# Patient Record
Sex: Female | Born: 1940 | Race: White | Hispanic: No
Health system: Midwestern US, Community
[De-identification: ages and names within clinical notes are randomized; demographics above are authoritative.]

---

## 2021-08-02 IMAGING — MG SCREENING MAMMOGRAM DIGITAL
4 series · 4 of 4 positions shown · non-contrast
Comparison: 10/09/2021 and 08/27/2020
COMPARISON: 10/09/2021 and 08/27/2020

------------- REPORT [id number] -------------
Procedure(s): MM screening mammo BI

DIGITAL SCREENING BILATERAL MAMMOGRAM
INDICATION: Routine screening

[R CC]
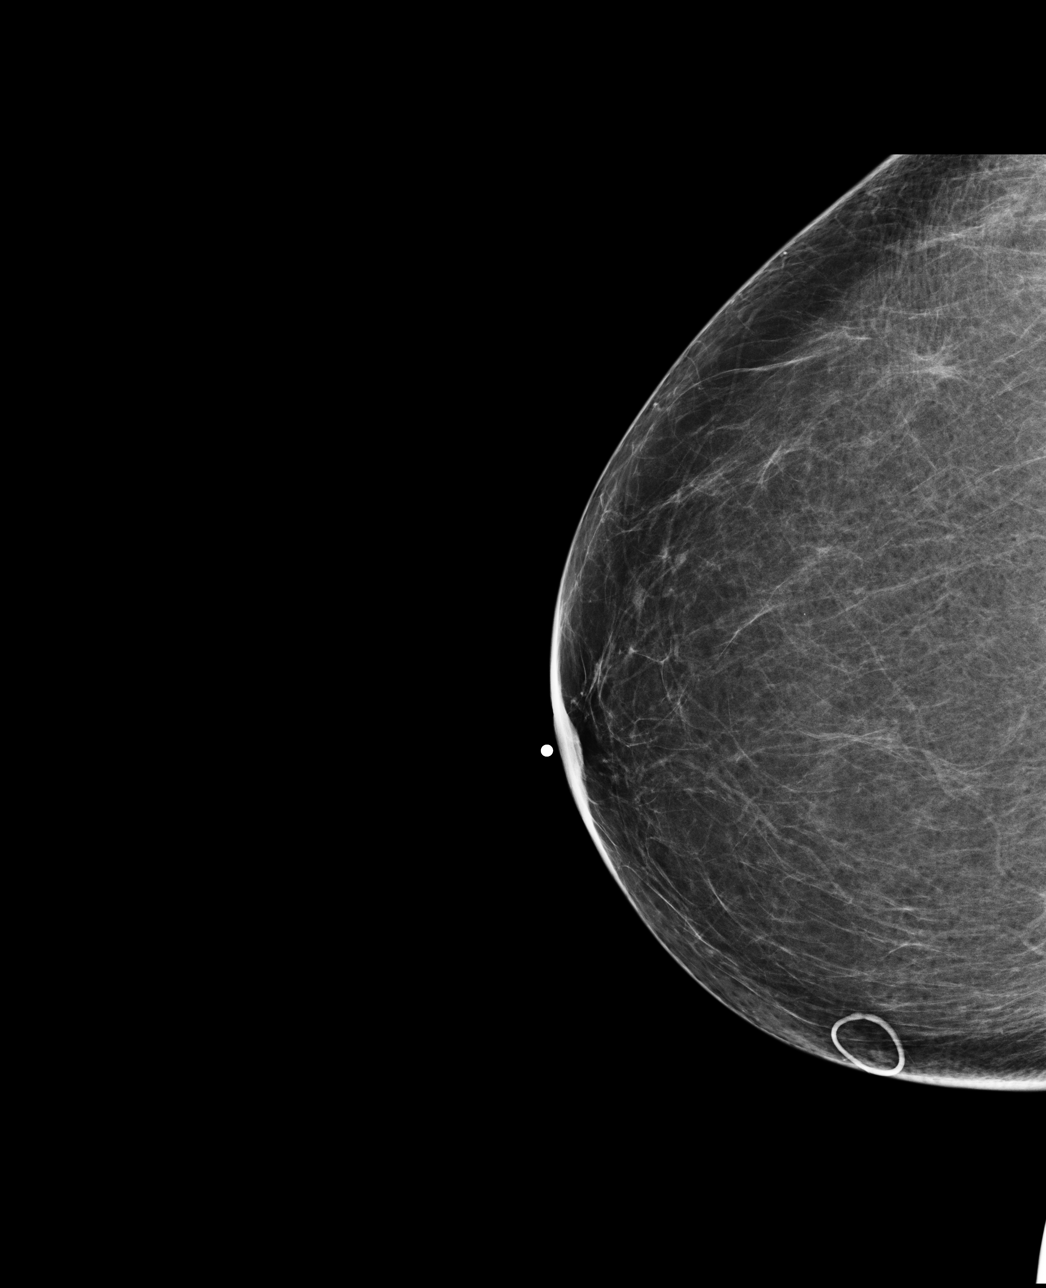

[R MLO]
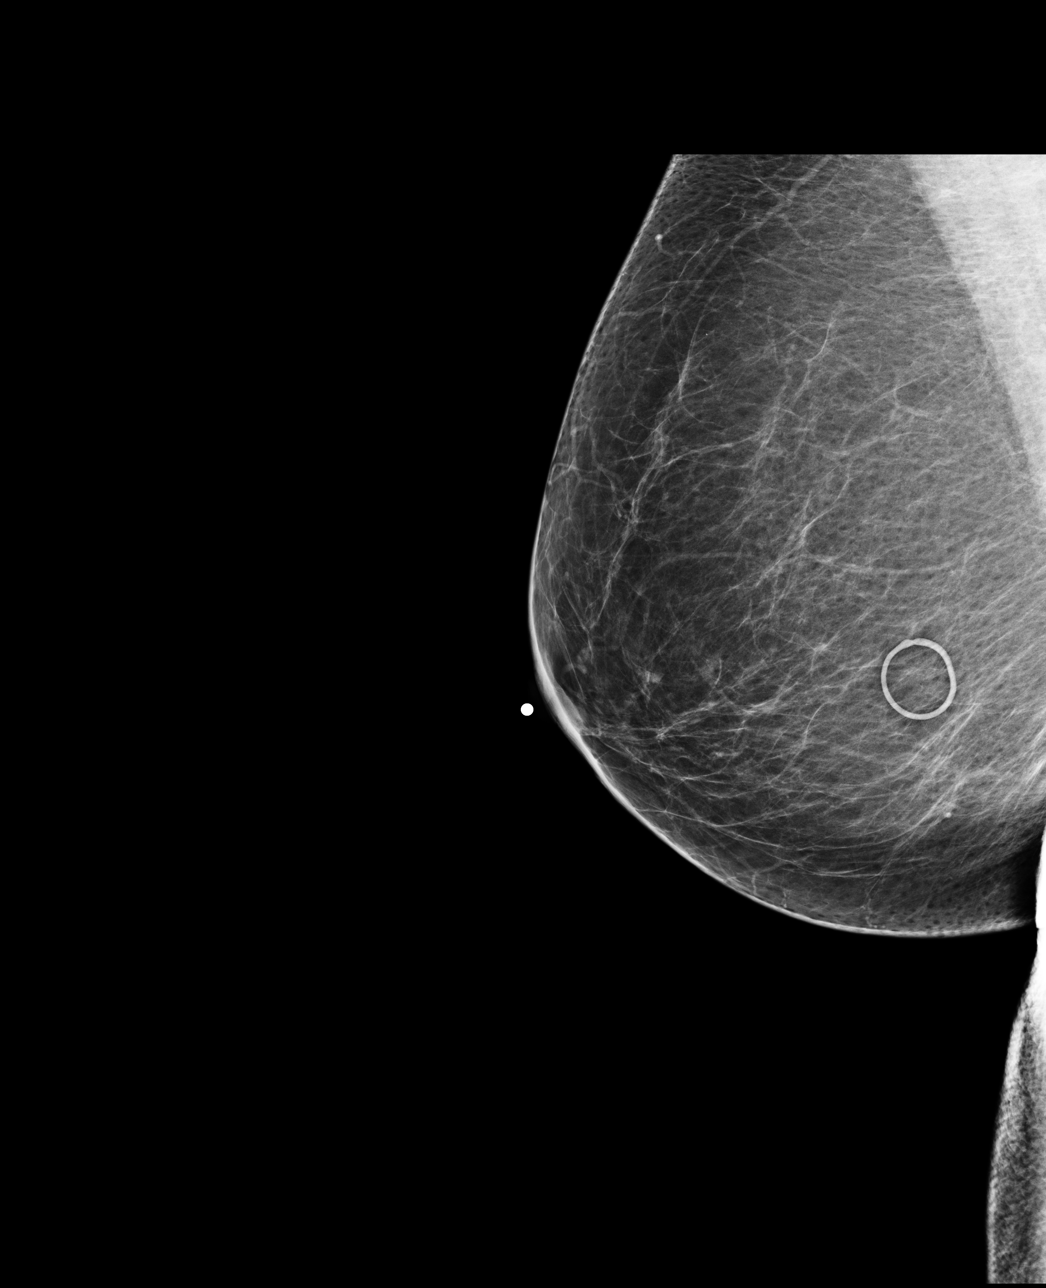

[L MLO]
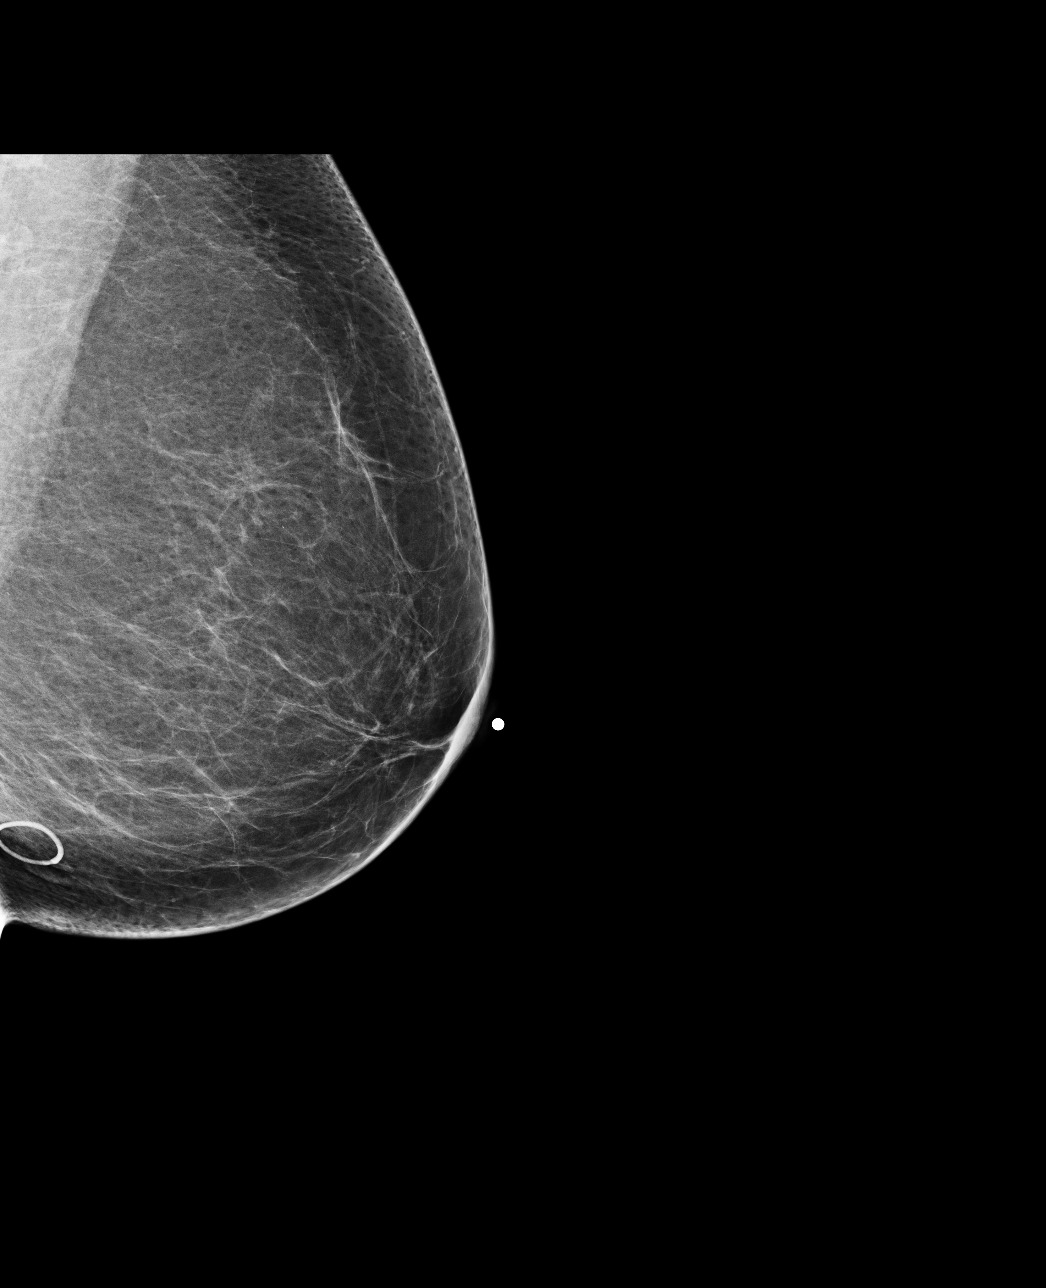

[L CC]
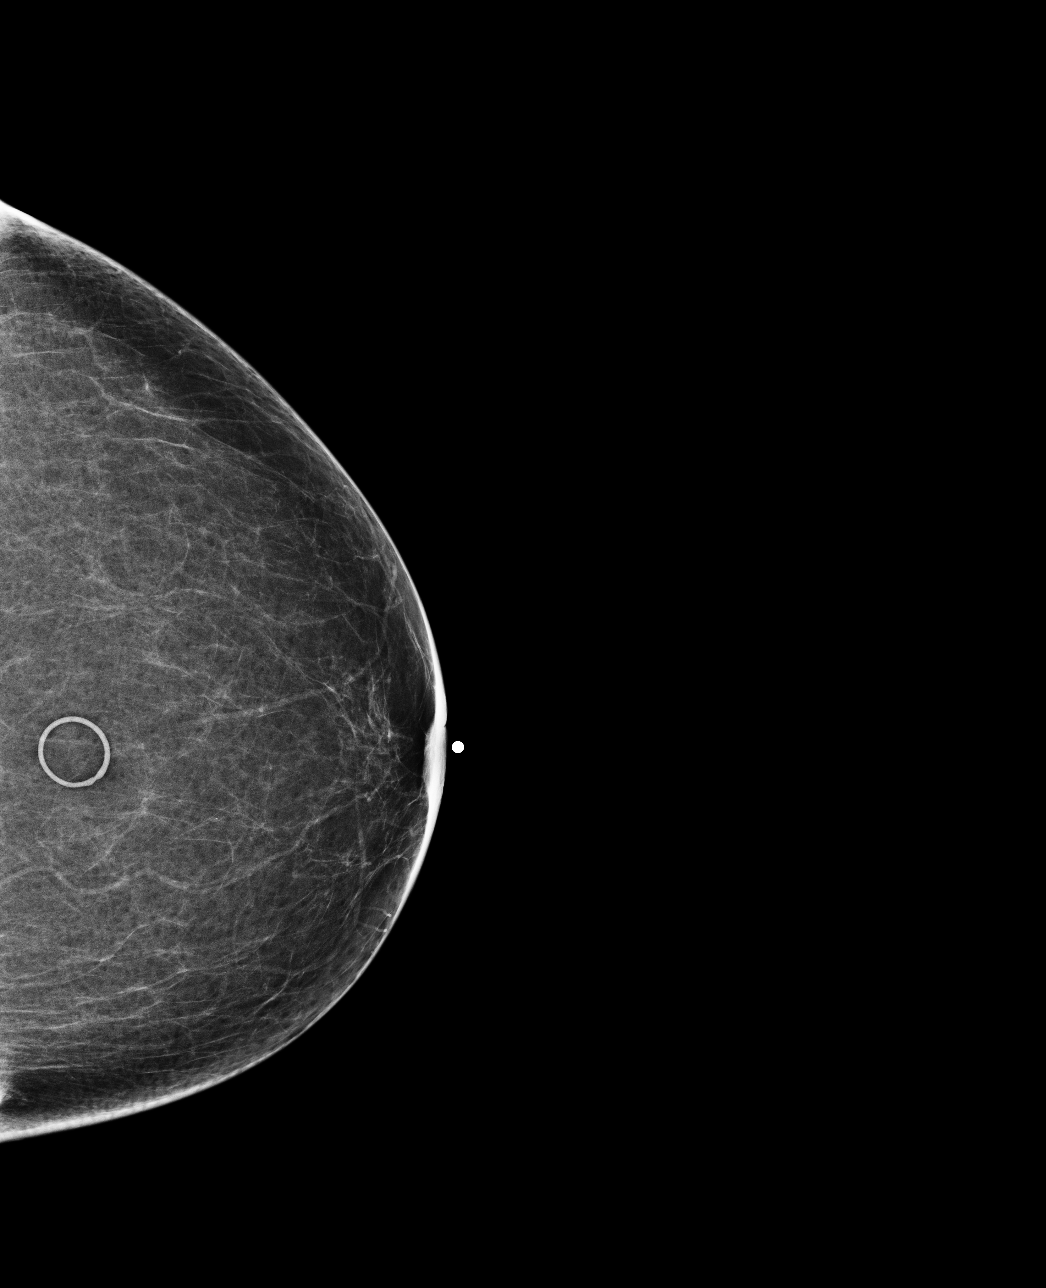

[4 of 4 positions shown; findings below may reference images not displayed]

FINDINGS: Breast density: Category A (almost entirely fatty)

No dominant masses, areas of architectural distortion or
suspicious clusters of
microcalcifications are seen.
IMPRESSION: Normal mammogram.  No significant change from
previous study

BI-RADS: 1 (normal-annual screening mammography)

Our facilities are accredited by the [HOSPITAL] Mammography
Program.

------------- REPORT [id number] -------------
Procedure(s): MM screening mammo BI

DIGITAL SCREENING BILATERAL MAMMOGRAM
FINDINGS: Breast density: Category A (almost entirely fatty)

No dominant masses, areas of architectural distortion or
suspicious clusters of
microcalcifications are seen.
IMPRESSION: Normal mammogram.  No significant change from
previous study

BI-RADS: 1 (normal-annual screening mammography)

Our facilities are accredited by the [HOSPITAL] Mammography
Program.

## 2022-02-21 IMAGING — DX C-Spine
3 series · 3 of 3 positions shown · non-contrast
Comparison: None

Procedure(s): XR cervical spine 3V

EXAM:  CERVICAL SPINE
INDICATION: Three-view cervical spine, pain

[left lateral]
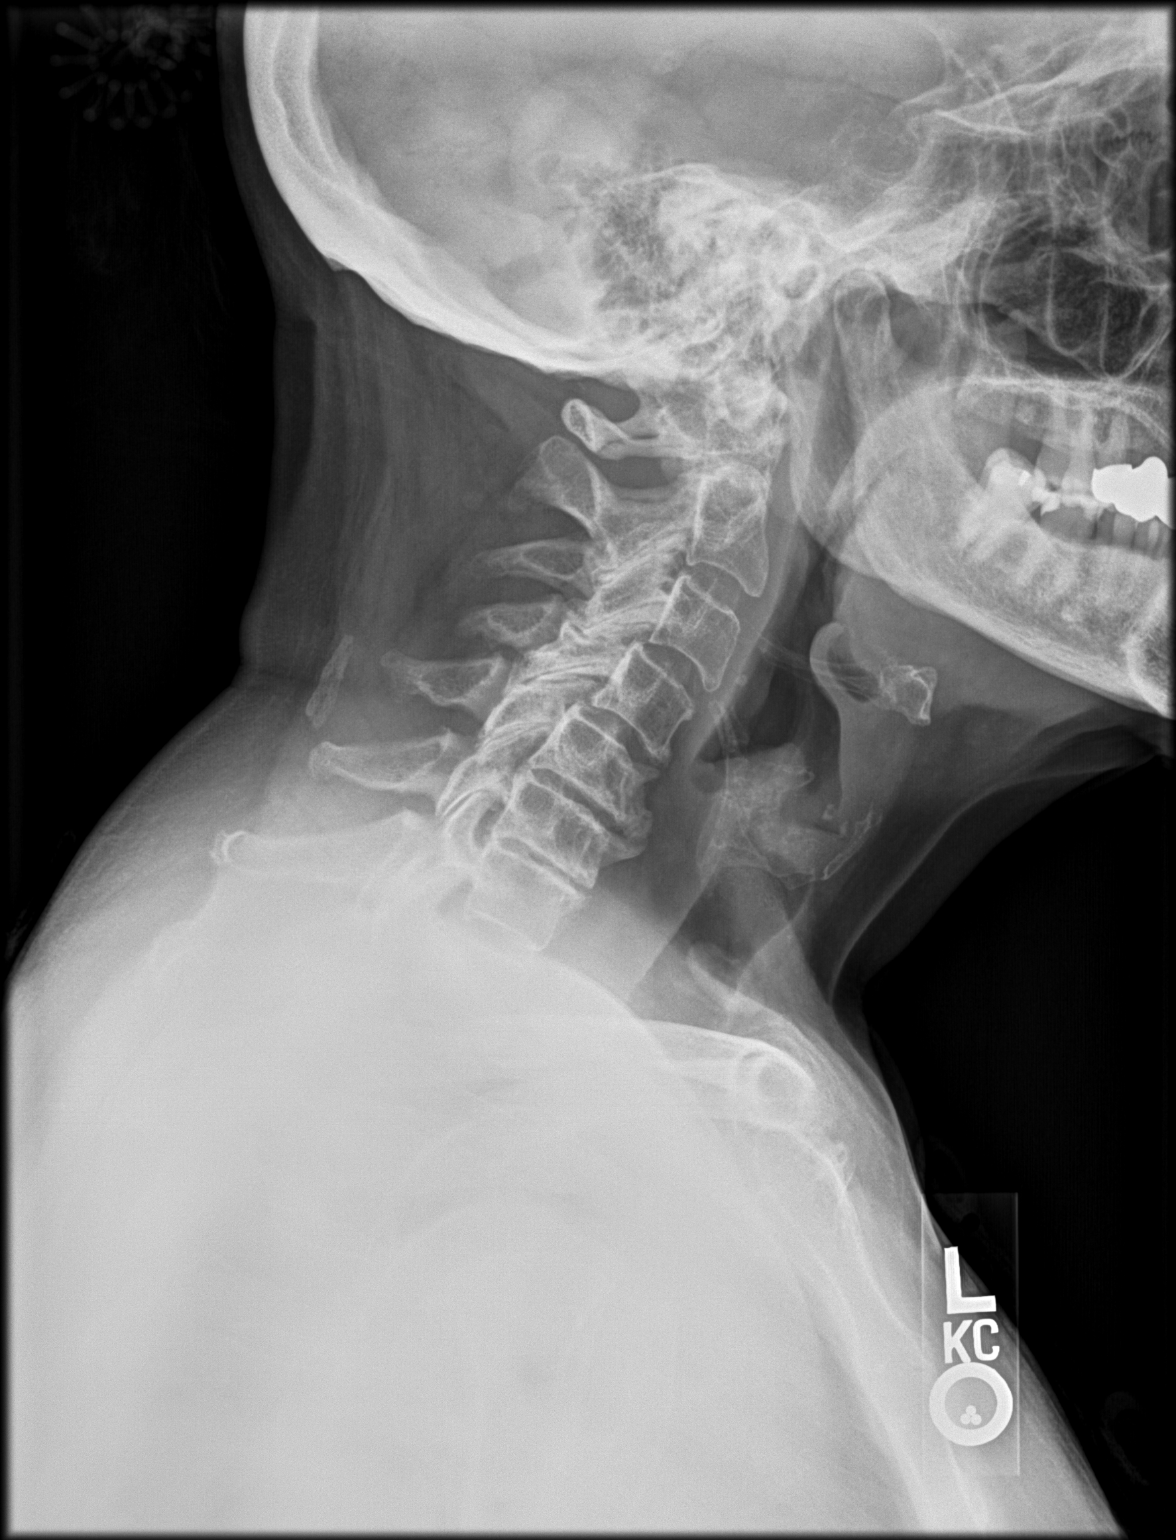

[AP (1 of 2)]
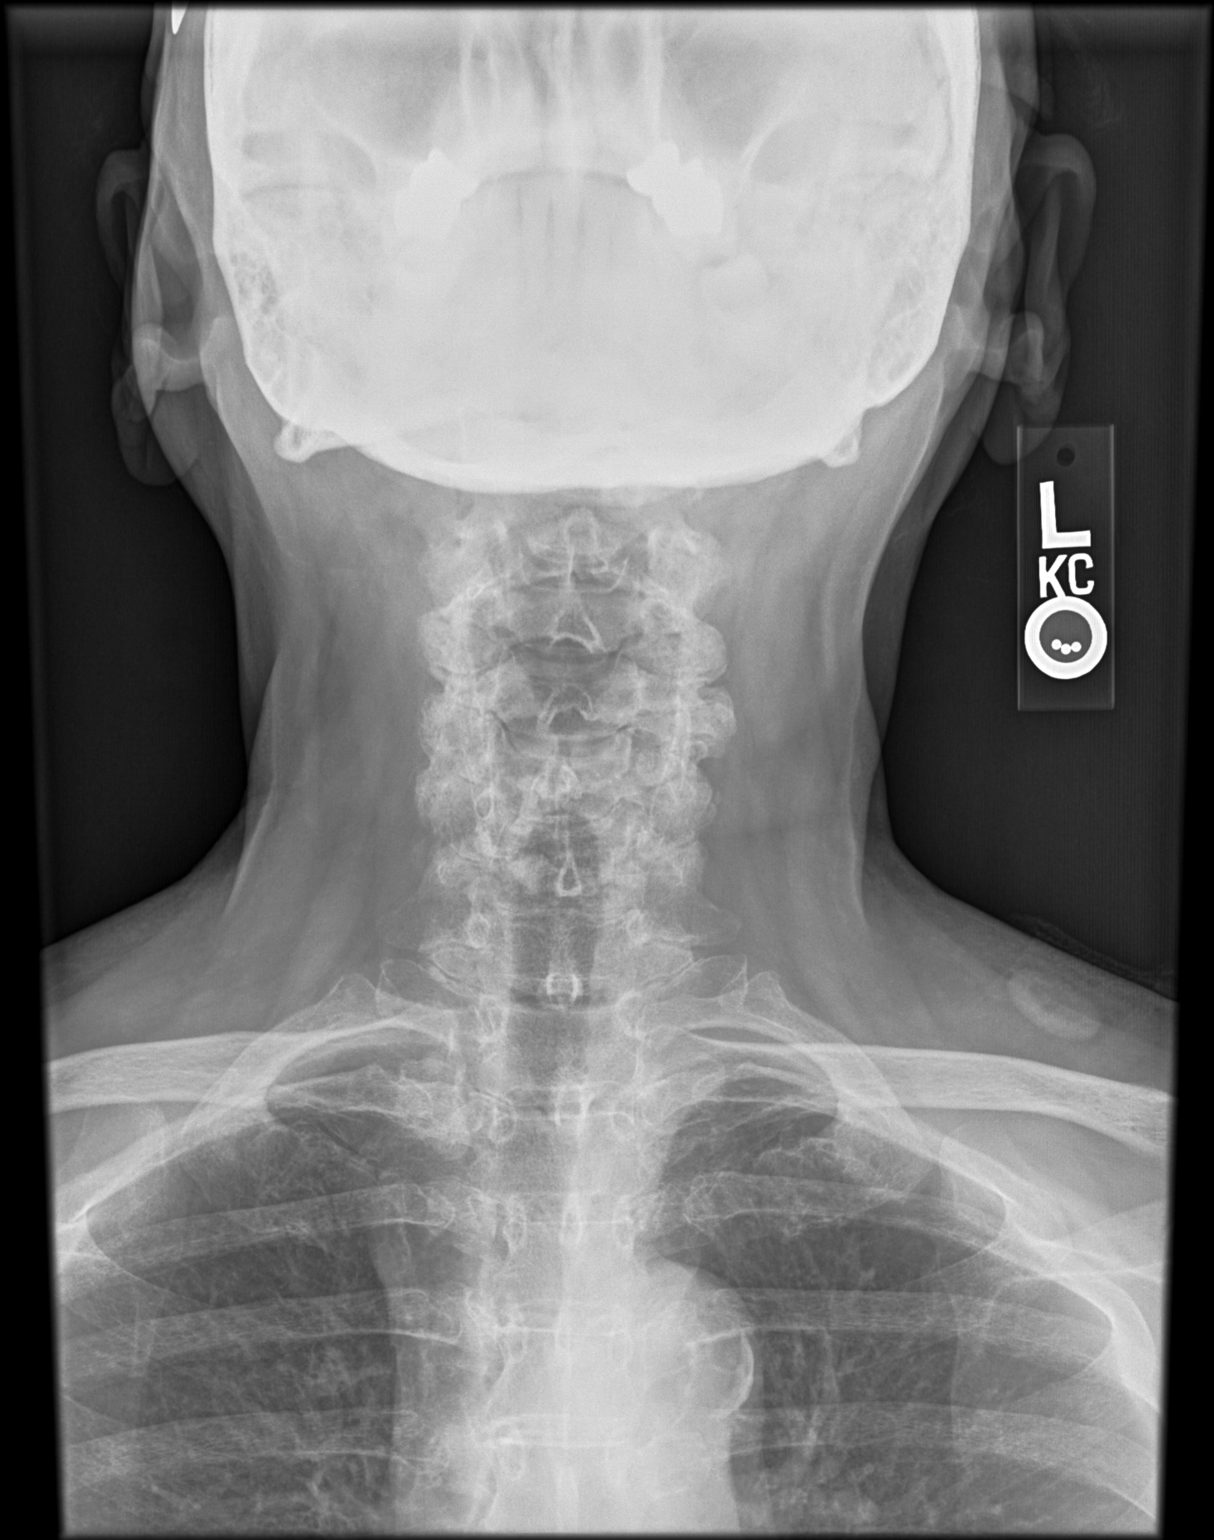

[AP (2 of 2)]
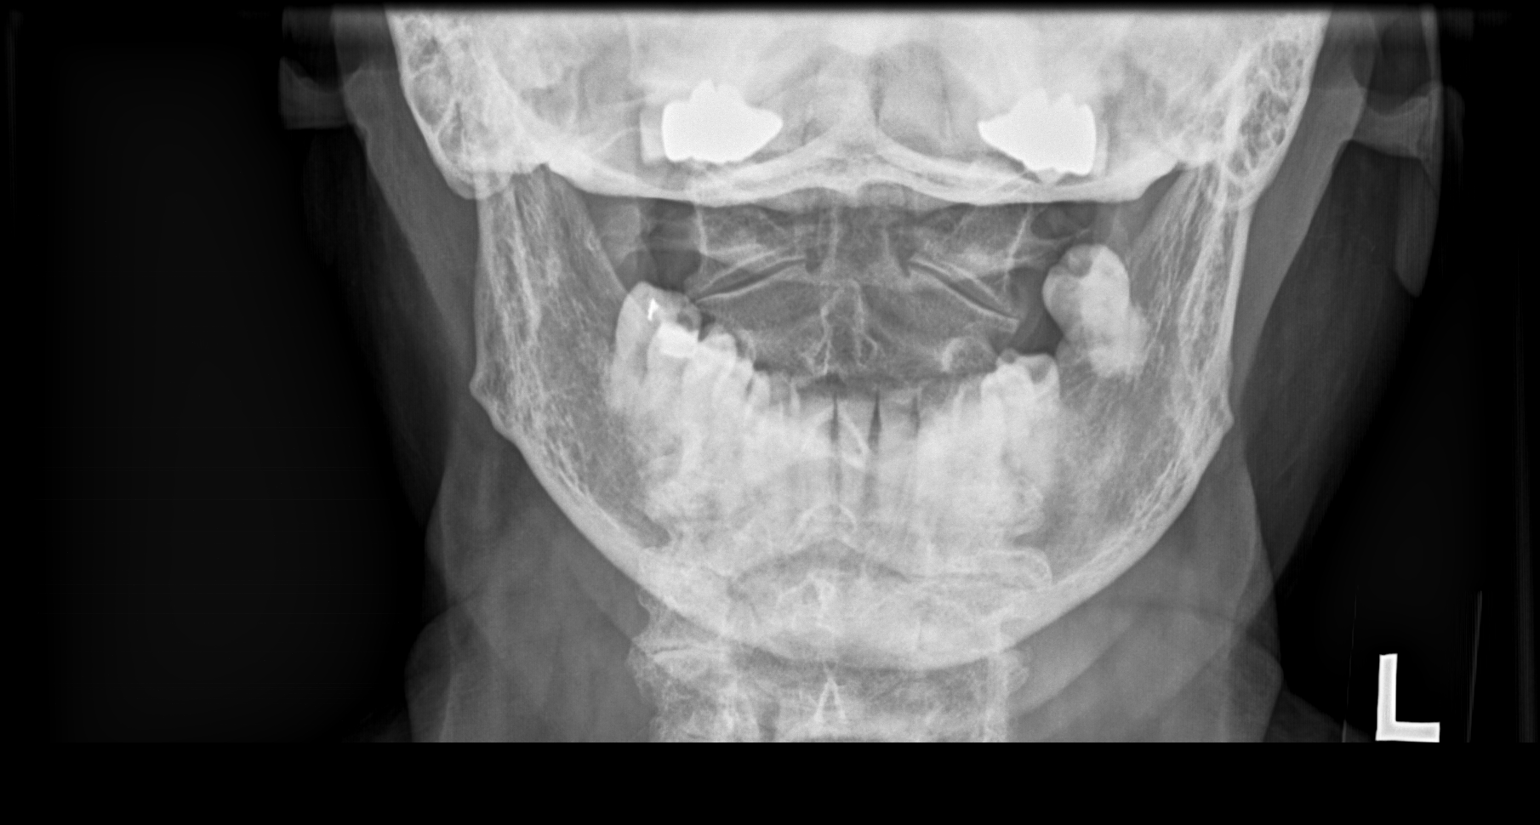

[3 of 3 positions shown; findings below may reference images not displayed]

PROCEDURE: AP,  flexion, neutral and extension cervical
spine views were
obtained.
FINDINGS: C1-C7 were visualized. Straightening of the
cervical spine with radial
reversal of cervical doses in the lower cervical elements.
Disc narrowing and
osteophyte formation at the C4 to the C5 level measuring 2
mm with anterior and
posterior osteophytes. More severe disc narrowing and
osteophyte vernation at
the C3 5 6 level. Severe disc narrowing at the C6-7 level
without significant
osteophyte formation. Extensive facet degenerative changes
at multiple lateral
cervical levels
IMPRESSION: Cervical disc degeneration most severe at the
C5-6, C6-7 and C7-T1
levels. Minimal anterolisthesis of C3-4 on C5 is likely
secondary to facet
degenerative change.

## 2022-03-14 IMAGING — DX L/S Spine
5 series · 5 of 5 positions shown · non-contrast
Comparison: None.

Procedure(s): XR lumbar spine min 4V

LUMBAR SPINE 5 VIEWS
INDICATION: Back pain

[AP (1 of 3)]
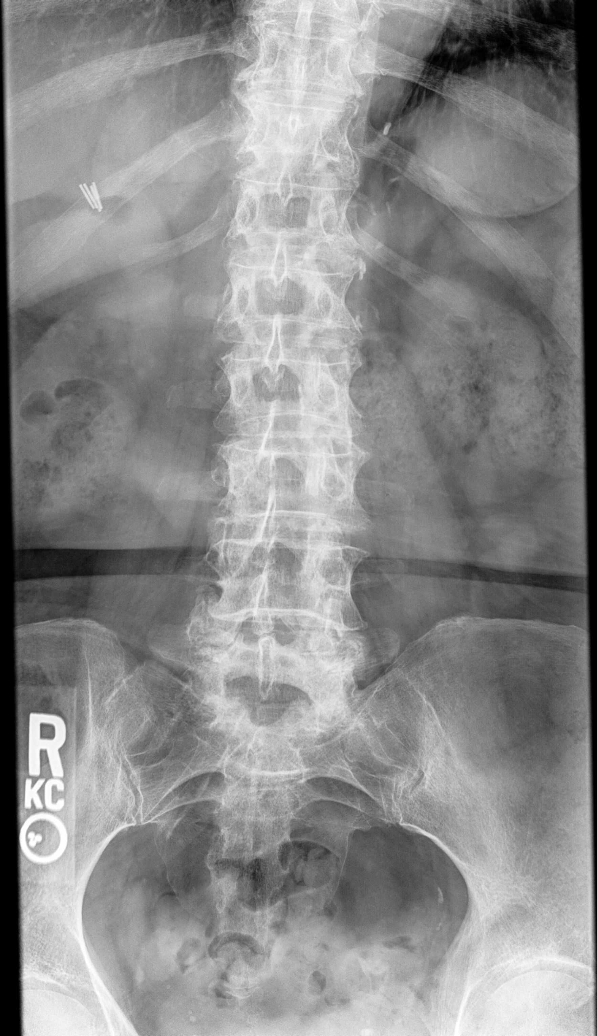

[AP (2 of 3)]
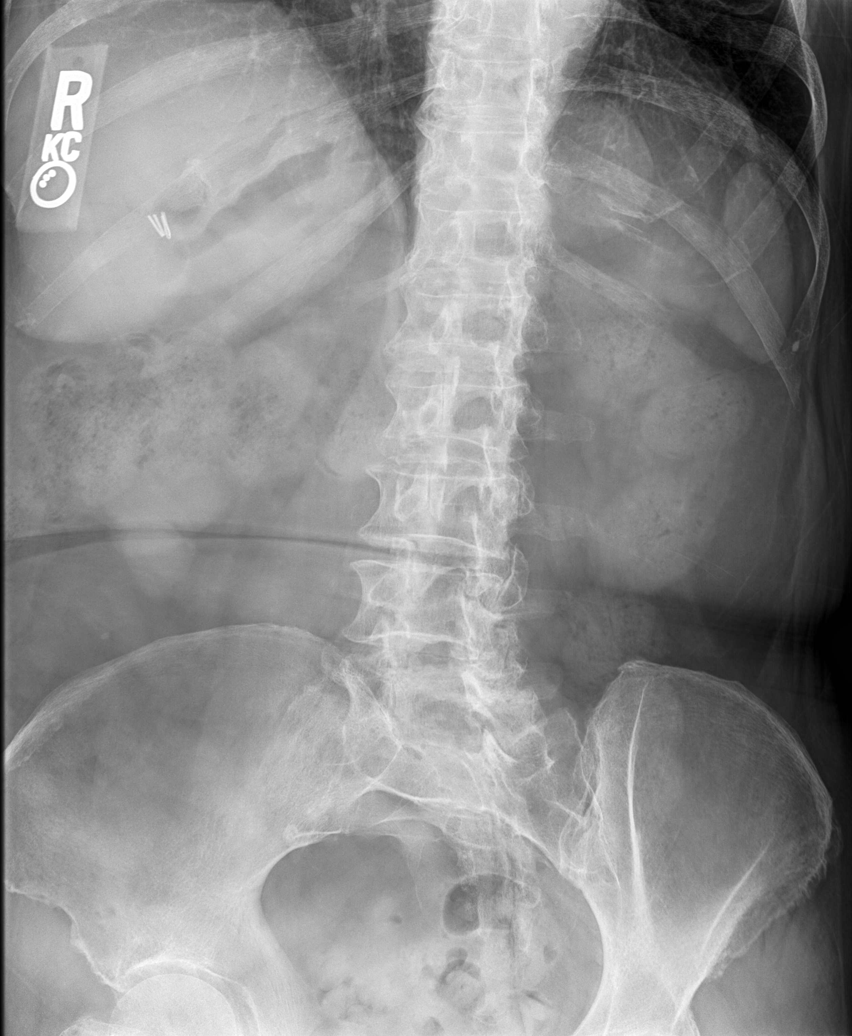

[AP (3 of 3)]
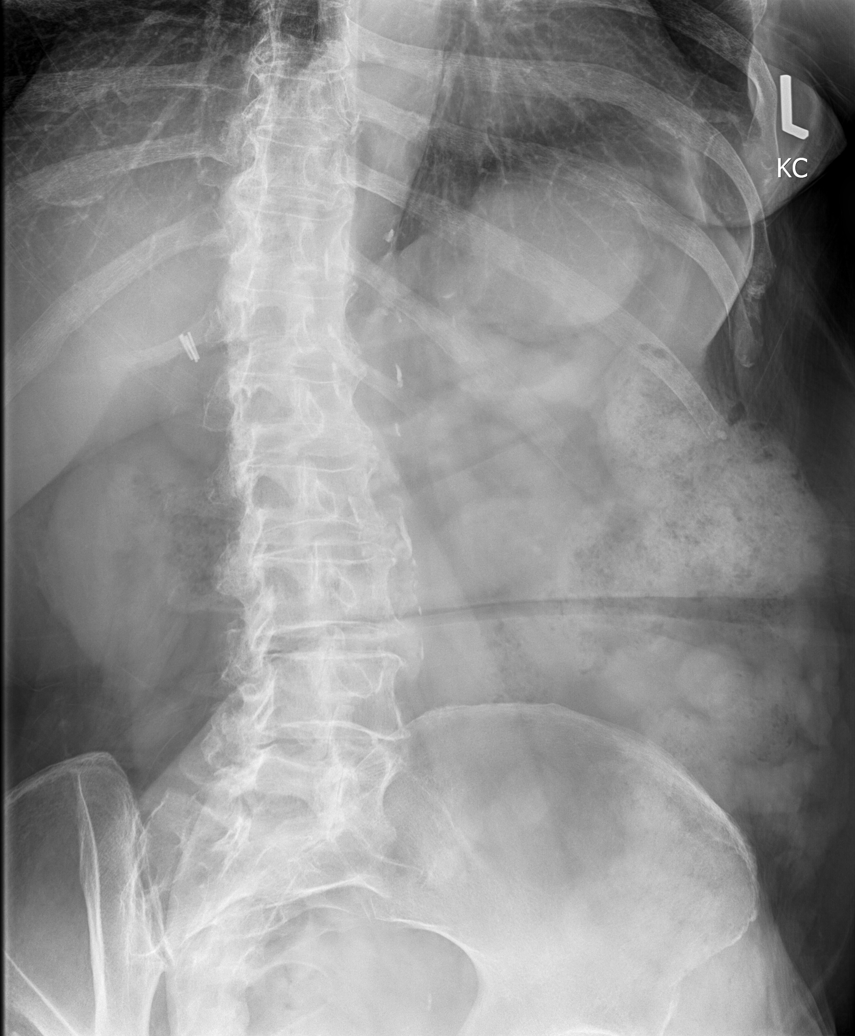

[left lateral (1 of 2)]
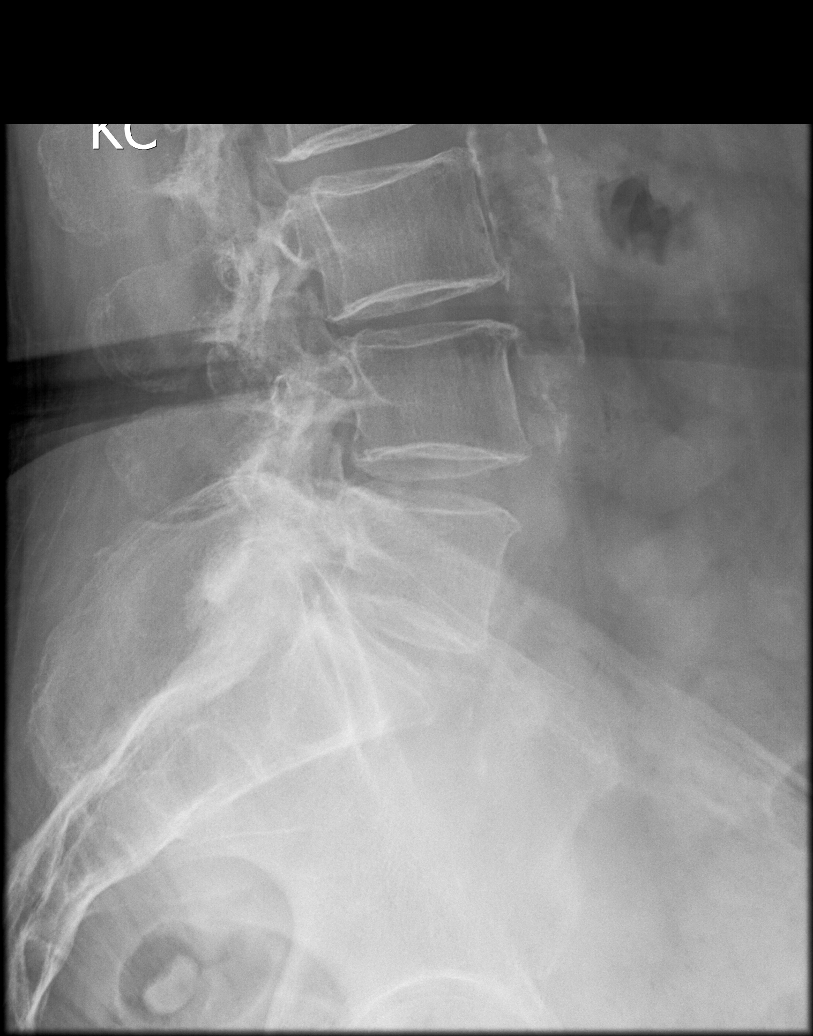

[left lateral (2 of 2)]
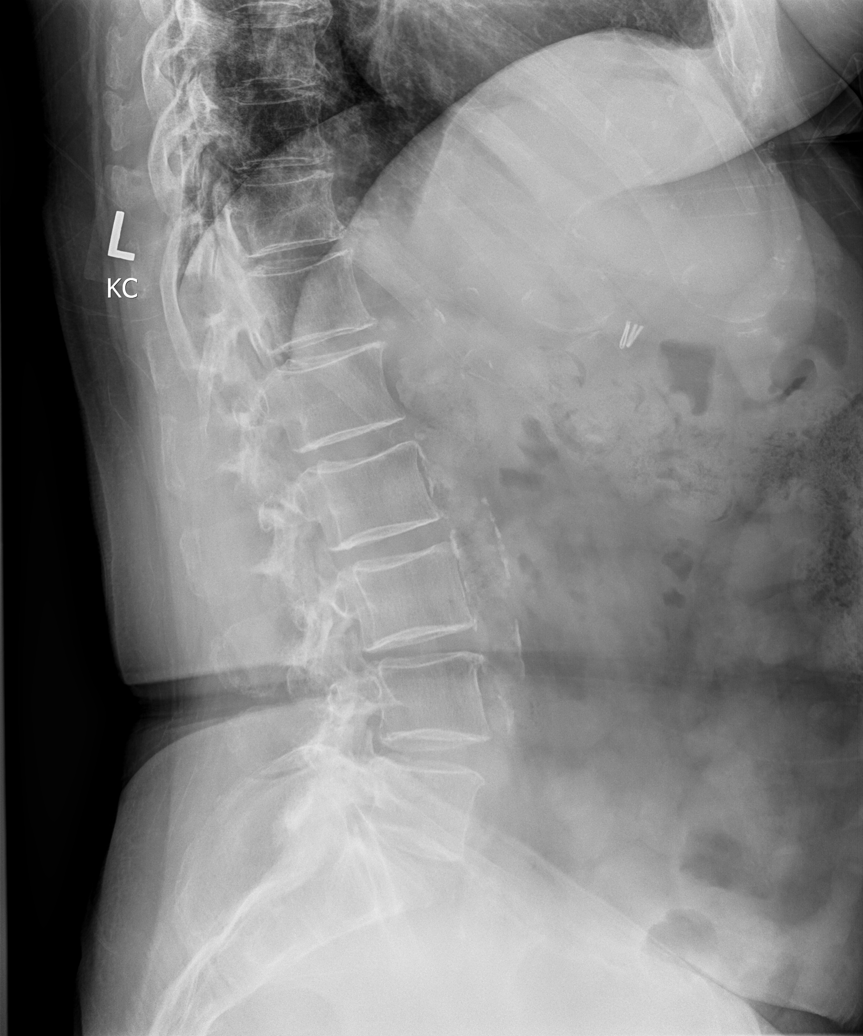

[5 of 5 positions shown; findings below may reference images not displayed]

FINDINGS: AP, lateral, and coned-down views of the lumbosacral
junction obtained.

The alignment of the lumbar spine appears maintained.
Vertebral body heights
appear maintained. Degenerative facet changes at L4-L5 and
L5-S1. No compression
fracture.
IMPRESSION: Degenerative facet changes at L4-L5 and L5-S1.

No acute fracture.

[id number]

## 2022-03-14 IMAGING — DX Shoulder
3 series · 3 of 3 positions shown · non-contrast
Comparison: none

Procedure(s): XR shoulder LT min 2V

LEFT SHOULDER RADIOGRAPHS 3 VIEWS 03/14/2022 [DATE]:
INDICATION: left shoulder pain.
COMPARISON STUDIES: None.
TECHNIQUE/FINDINGS:
No acute fracture or dislocation. No destructive bony
lesion. Moderate
degenerative changes AC joint and glenohumeral joint. Cystic
degenerative
changes superolateral humeral head.

[AP (1 of 3)]
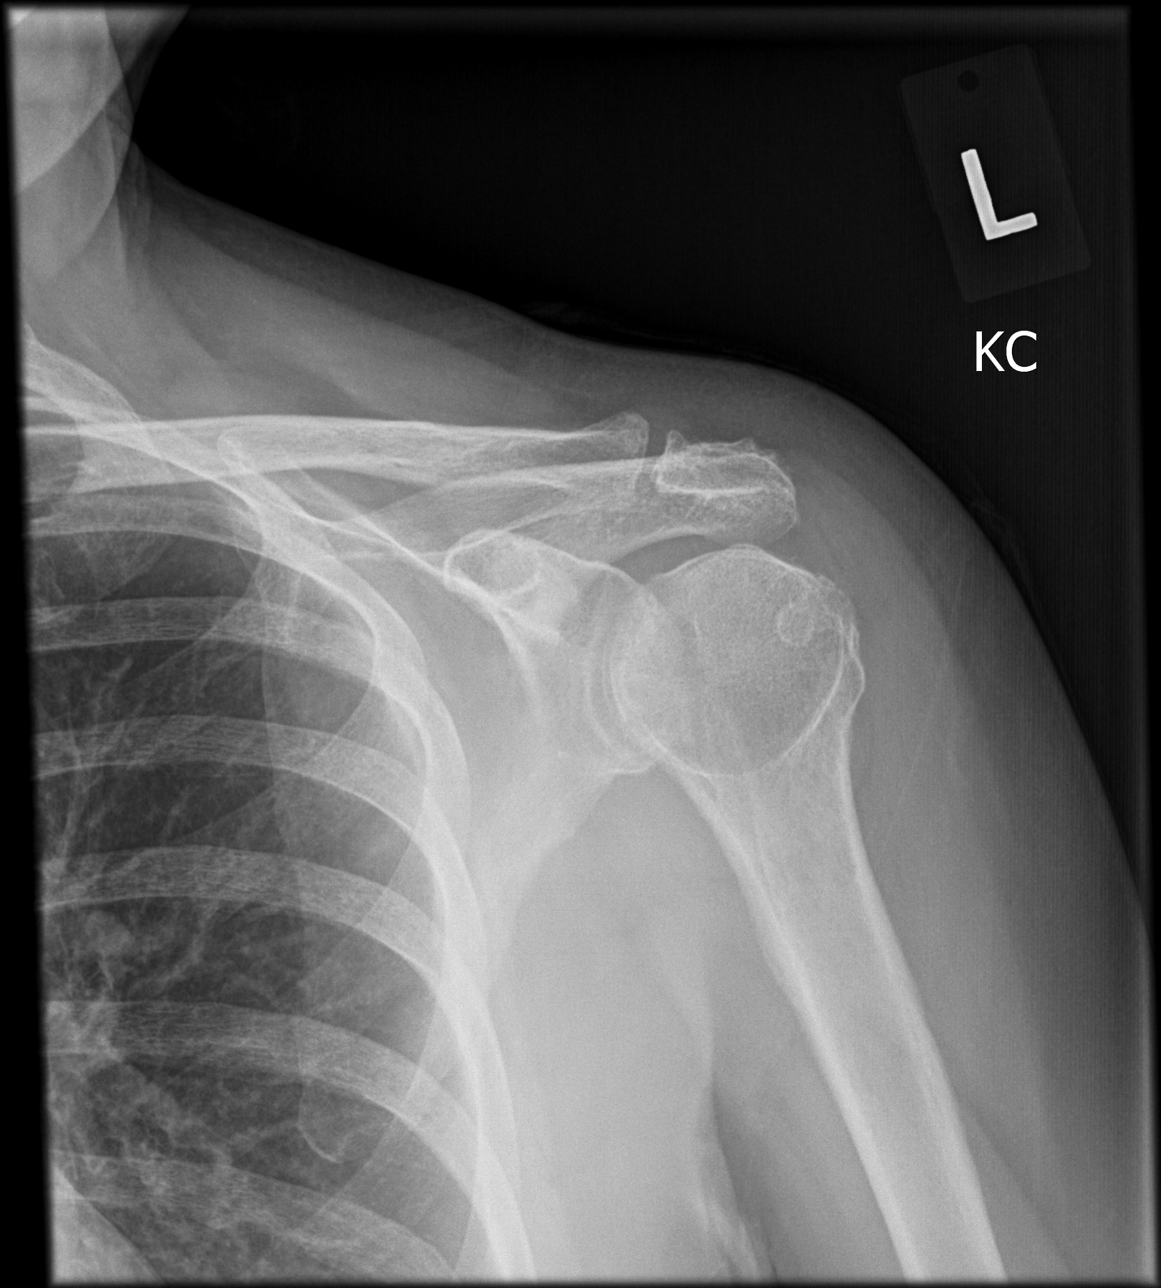

[AP (2 of 3)]
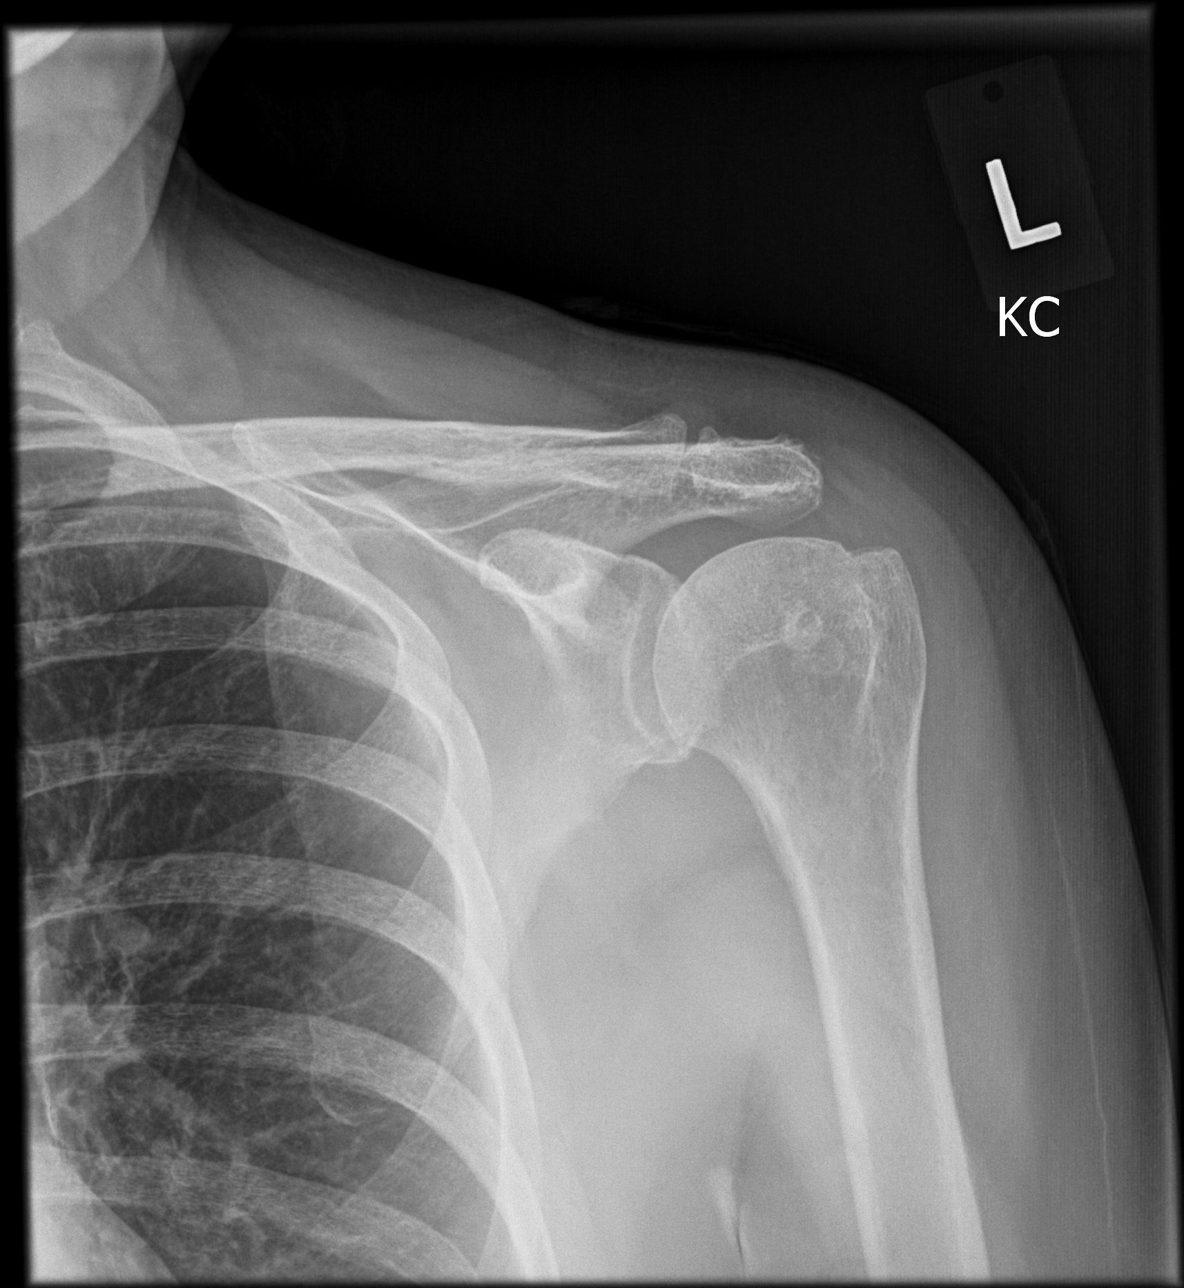

[AP (3 of 3)]
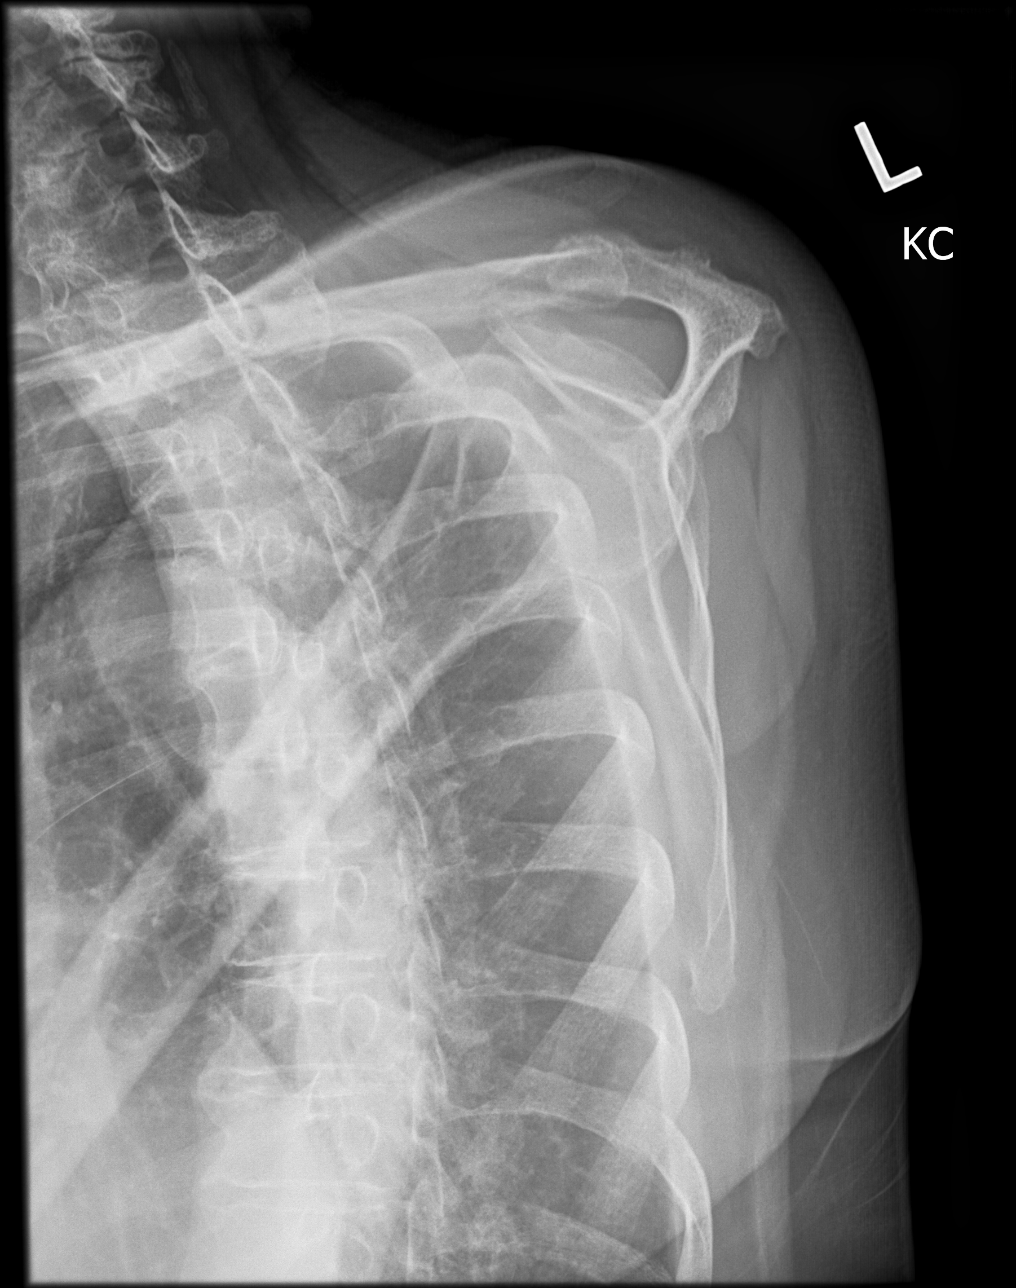

[3 of 3 positions shown; findings below may reference images not displayed]

IMPRESSION: No acute bony abnormality.

Moderate osteoarthritic degenerative changes of the AC joint
and glenohumeral
joint.

## 2022-03-16 IMAGING — MR CERVICAL SPINE^ROUTINE
4 of 5 series · 36 of 48 positions shown · non-contrast
Comparison: none

Procedure(s): MR cervical spine wo con

RADIOLOGIC EXAM:  MRI of the cervical spine without
contrast.
INDICATIONS: Chronic neck pain.
TECHNIQUE: Routine MRI of the cervical spine utilizing
standard sequences
without contrast.

[Series 3: T2 · sagittal · 3.0mm · 0.75mm/px · 9 of 15 slices shown (1 of 2)]
[im 1/15]
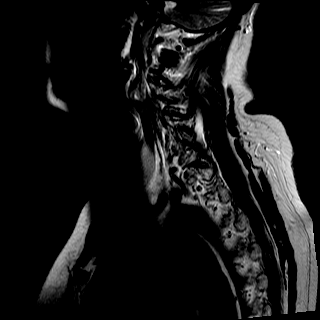
[im 2/15]
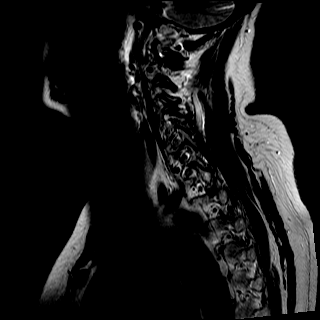
[im 4/15]
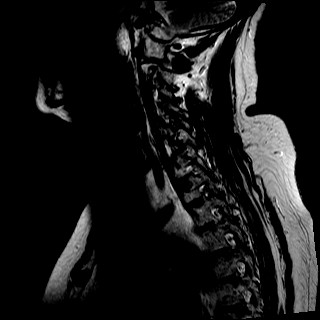
[im 6/15]
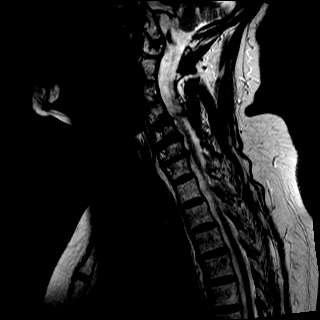
[im 8/15]
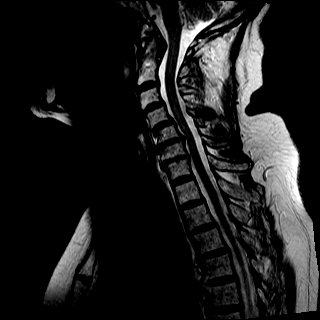
[im 9/15]
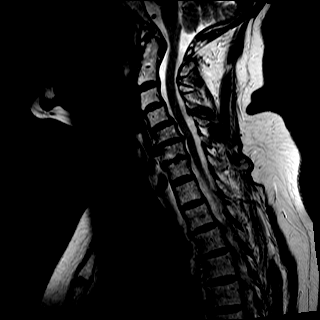
[im 11/15]
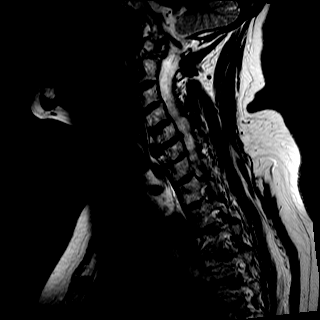
[im 13/15]
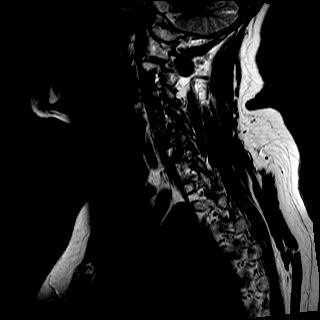
[im 15/15]
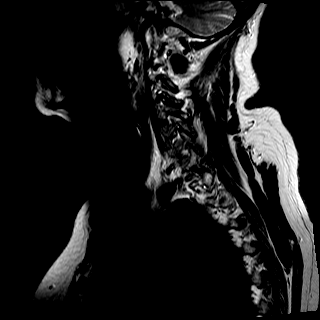

[Series 4: T1 · sagittal · 3.0mm · 0.75mm/px · 9 of 15 slices shown]
[im 1/15]
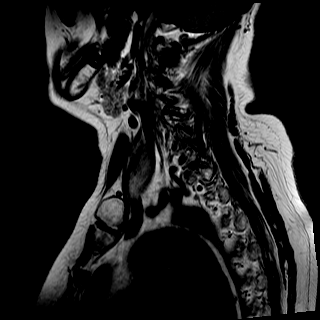
[im 2/15]
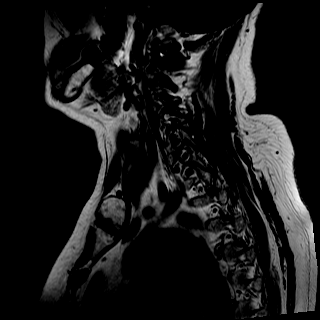
[im 4/15]
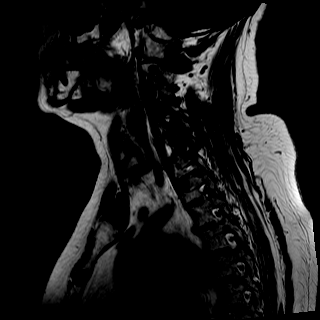
[im 6/15]
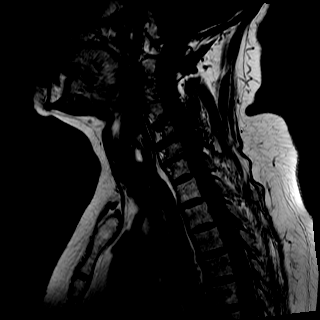
[im 8/15]
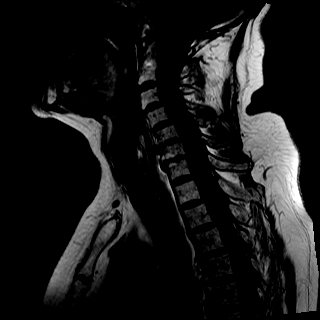
[im 9/15]
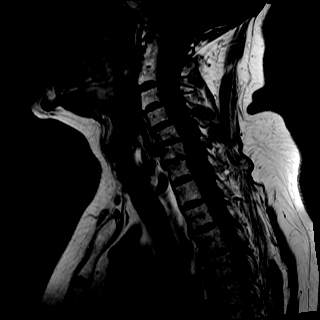
[im 11/15]
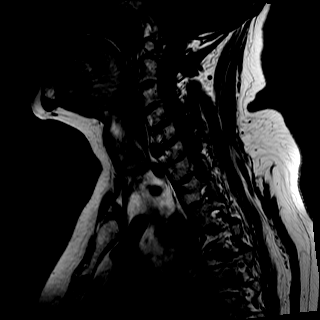
[im 13/15]
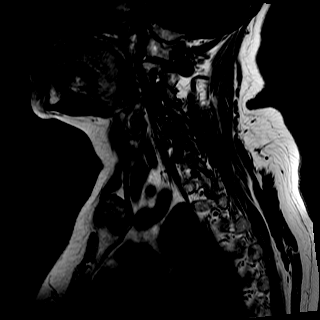
[im 15/15]
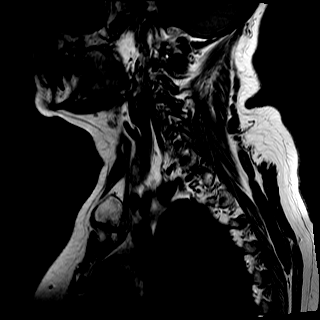

[Series 5: STIR · sagittal · 3.0mm · 0.47mm/px · 7 of 15 slices shown]
[im 1/15]
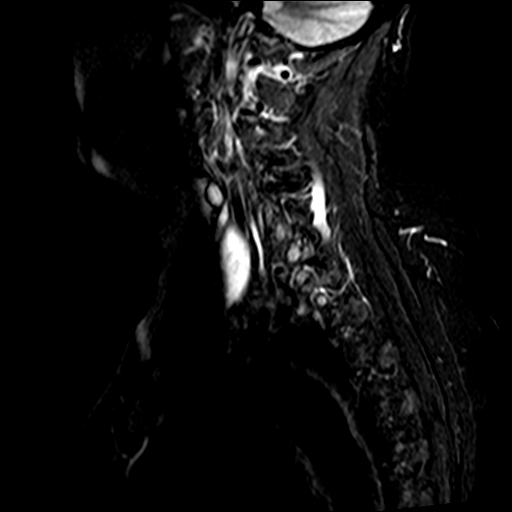
[im 3/15]
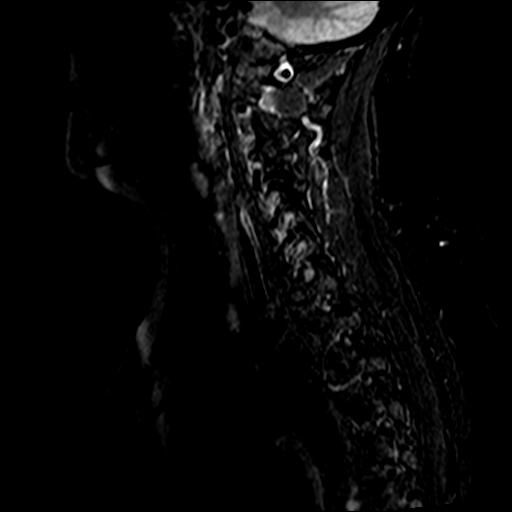
[im 5/15]
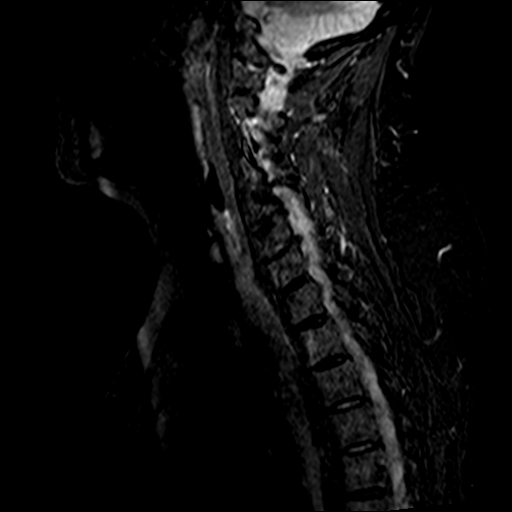
[im 7/15]
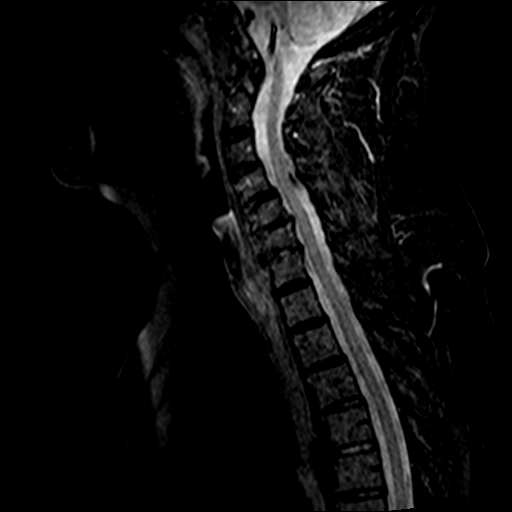
[im 9/15]
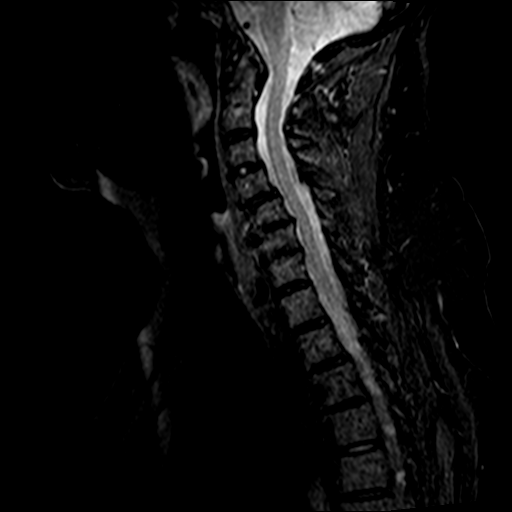
[im 11/15]
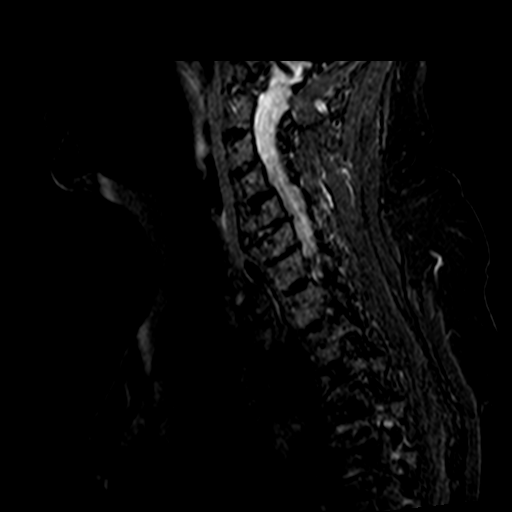
[im 13/15]
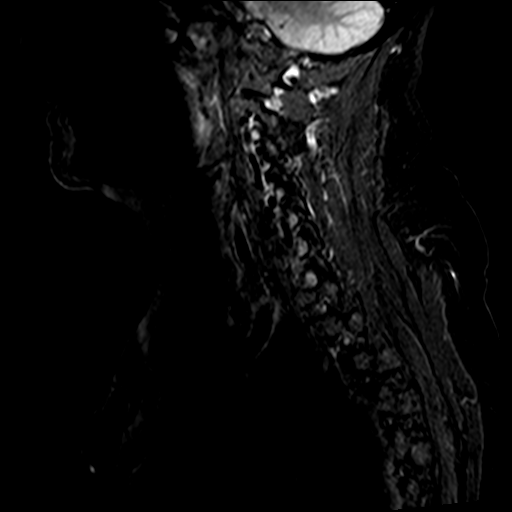

[Series 7: T2 · axial · 4.0mm · 0.62mm/px · z∈[-47,+45]mm · 11 of 20 slices shown (2 of 2)]
[im 1/20]
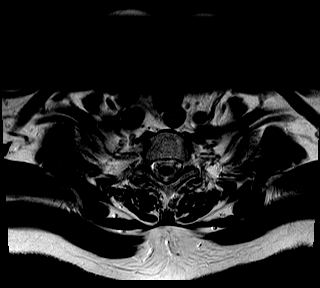
[im 2/20]
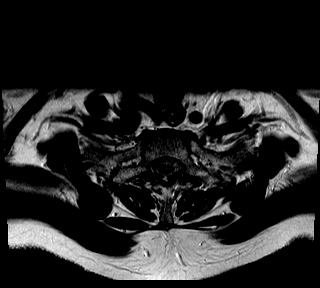
[im 4/20]
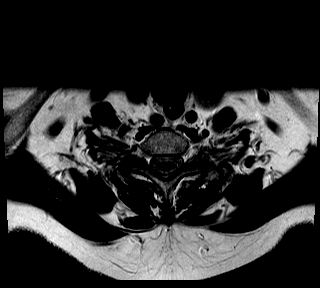
[im 6/20]
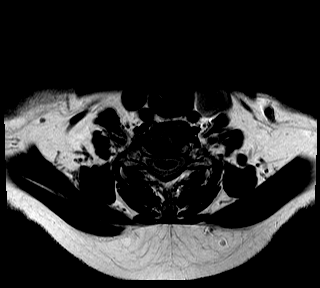
[im 8/20]
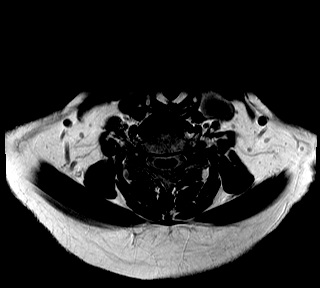
[im 10/20]
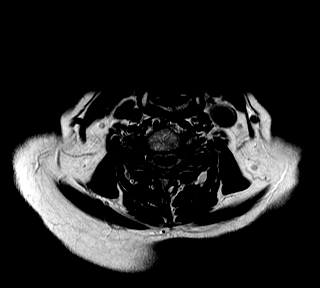
[im 12/20]
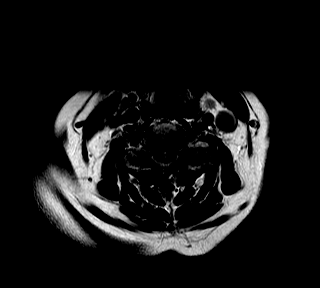
[im 14/20]
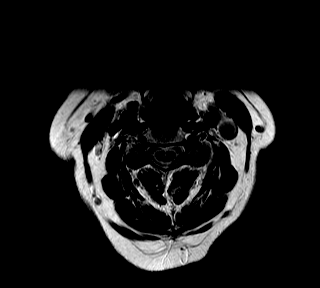
[im 16/20]
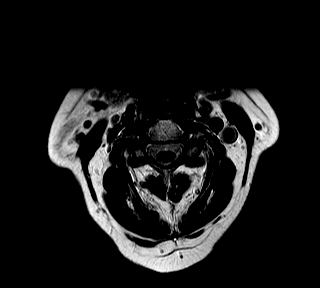
[im 18/20]
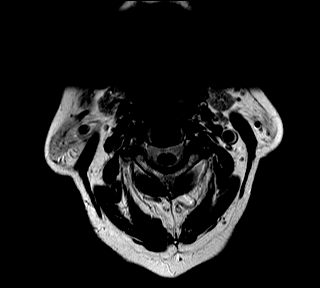
[im 20/20]
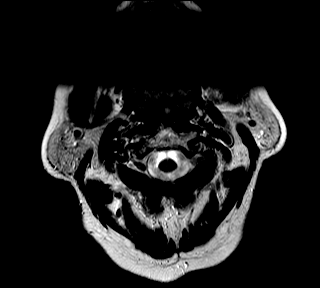

[36 of 48 positions shown; findings below may reference images not displayed]

FINDINGS: The bone marrow signal intensity is normal.

There is mild anterior listhesis of C3 on C4 and C4 on C5
between 3 and 4 mm.

The craniocervical junction is intact

The spinal cord signal intensity is unremarkable.

C1-C2: Narrowing of the atlantoaxial joint.

C2-C3: Bilateral hypertrophic degenerative facet disease.
There is no central
canal or neural foraminal stenosis.

C3-C4: Anterior listhesis. There is bilateral hypertrophic
degenerative facet
disease. There is right-sided neural foraminal narrowing.

C4-C5: Anterior listhesis. There is mild disc bulging. There
is bilateral
degenerative facet disease, left greater than right. There
is left-sided neural
foraminal stenosis.

C5-C6: Disc space narrowing with posterior disc bulging
causing effacement of
the ventral thecal sac. The AP dimension of the central
canal is narrowed to 10
mm. There is bilateral hypertrophic facet arthrosis. There
is mild right-sided
neural foraminal stenosis.

C6-C7: Disc space narrowing with Modic type II changes in
the endplates.. There
is mild disc bulging. There is no central canal or neural
foraminal stenosis.

C7-T1: Unremarkable. There is no disc bulge, protrusion, or
stenosis. The facet
joints are unremarkable.

Upper thoracic canal: Unremarkable.

The paraspinous musculature is unremarkable.
IMPRESSION: Severe multilevel cervical spondylosis. There is no
significant central canal
stenosis at any level.

Multilevel hypertrophic facet arthrosis. There is anterior
listhesis of C3 on C4
and C4 on C5.

Mild multilevel neural foraminal stenosis.

## 2022-04-04 IMAGING — MR MR lumbar spine wo con
4 of 6 series · 21 of 48 positions shown · non-contrast
Comparison: None.

Procedure(s): MR lumbar spine wo con

MRI LUMBAR SPINE WITHOUT CONTRAST
DATE: 04/04/2022
INDICATION: Lumbago with sciatica and radiculopathy
TECHNIQUE: Magnetic resonance imaging of the lumbar spine
was performed to
evaluate the spinal canal and contents.  No IV contrast was
administered.

[Series 2: T2 · sagittal · 4.0mm · 0.81mm/px · 7 of 17 slices shown (1 of 2)]
[im 1/17]
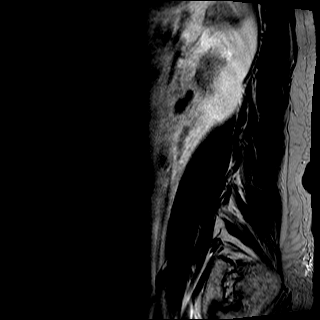
[im 3/17]
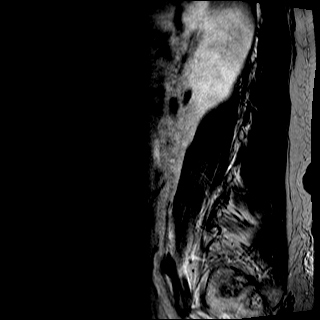
[im 6/17]
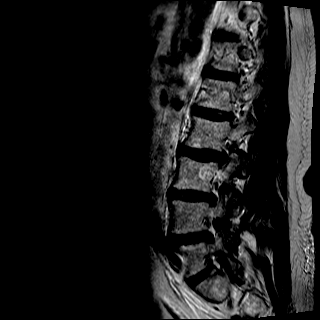
[im 9/17]
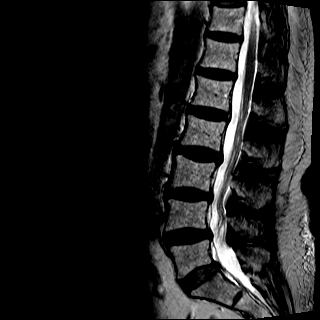
[im 11/17]
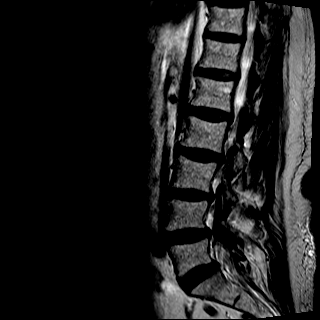
[im 14/17]
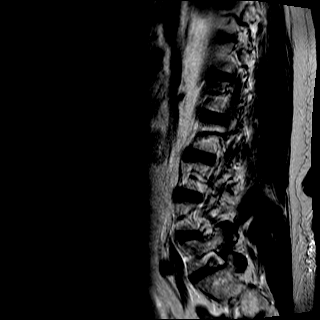
[im 17/17]
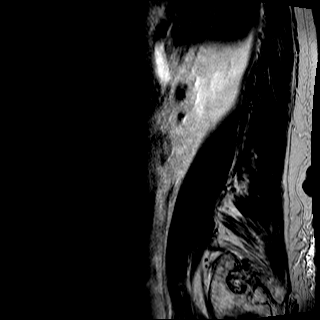

[Series 3: T1 · sagittal · 4.0mm · 0.41mm/px · 3 of 17 slices shown (1 of 2)]
[im 4/17]
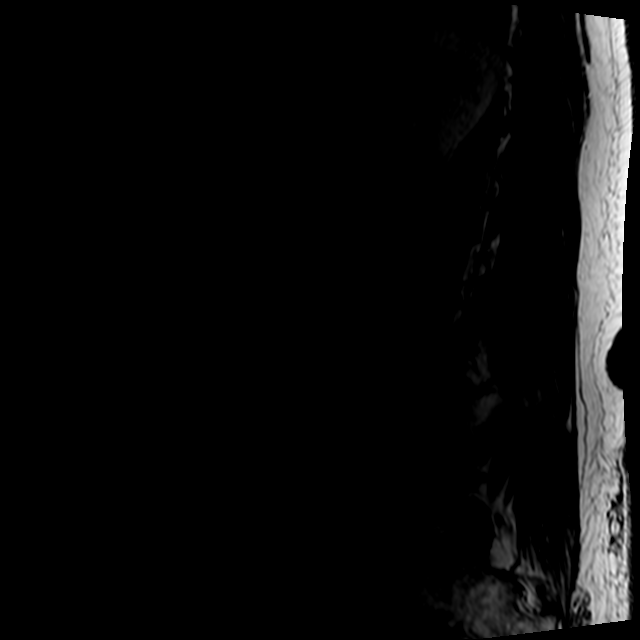
[im 10/17]
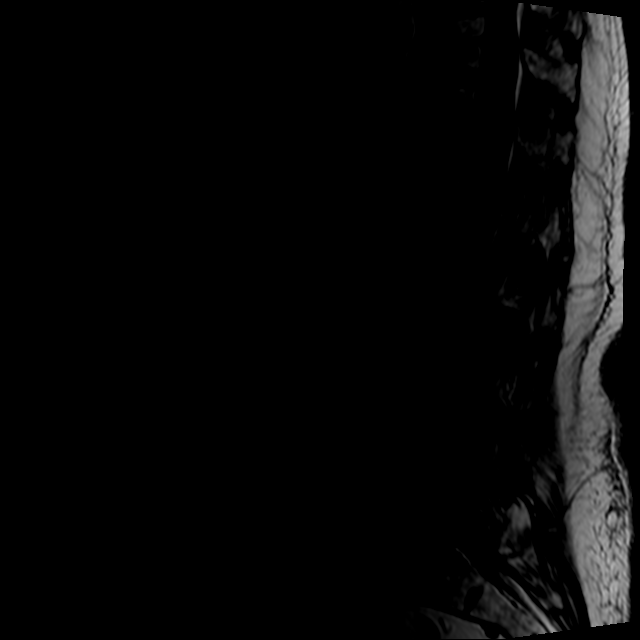
[im 17/17]
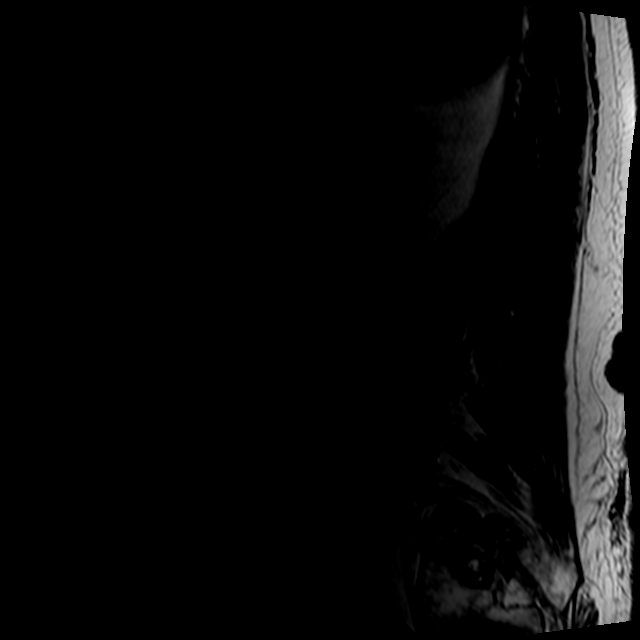

[Series 5: T2 · axial · 4.0mm · 0.45mm/px · z∈[-107,+116]mm · 8 of 39 slices shown (2 of 2)]
[im 1/39]
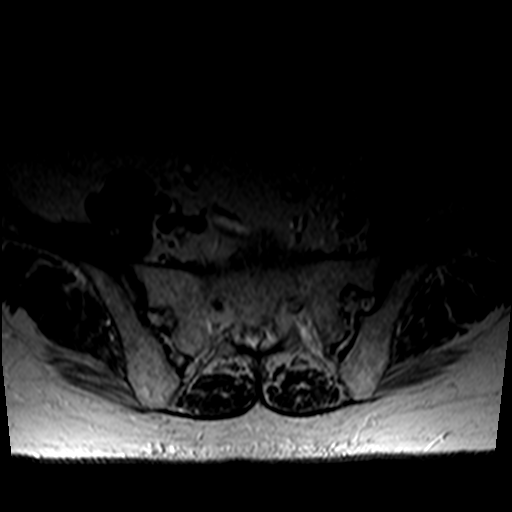
[im 6/39]
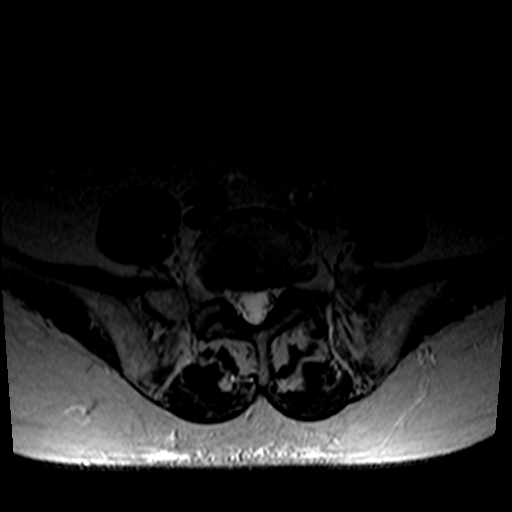
[im 12/39]
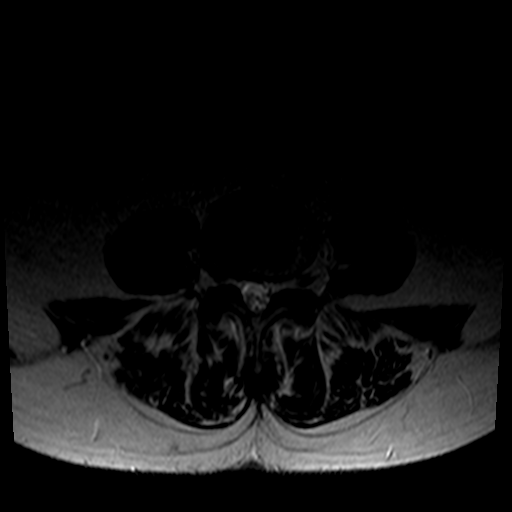
[im 18/39]
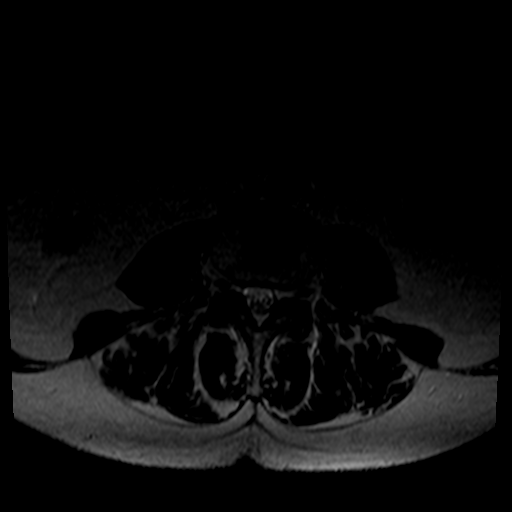
[im 21/39]
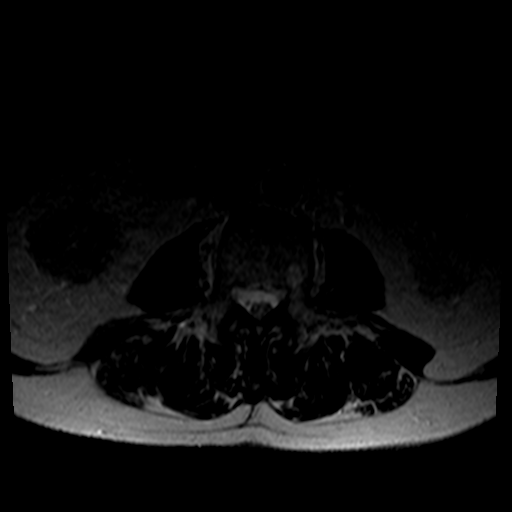
[im 27/39]
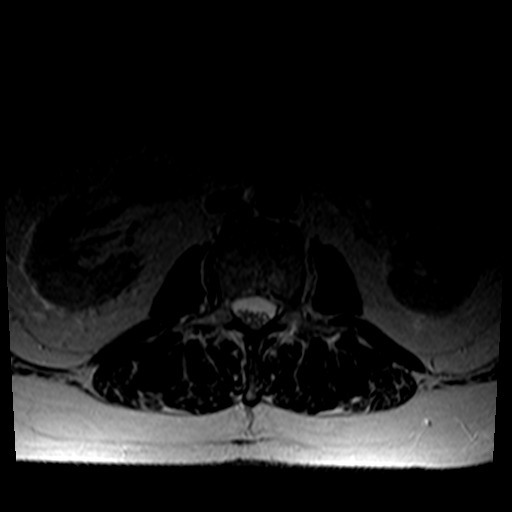
[im 33/39]
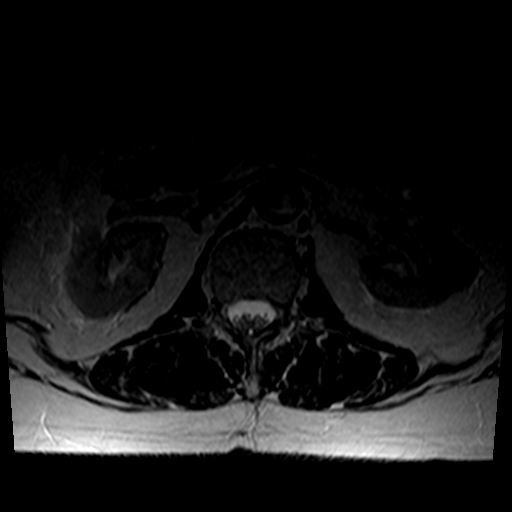
[im 39/39]
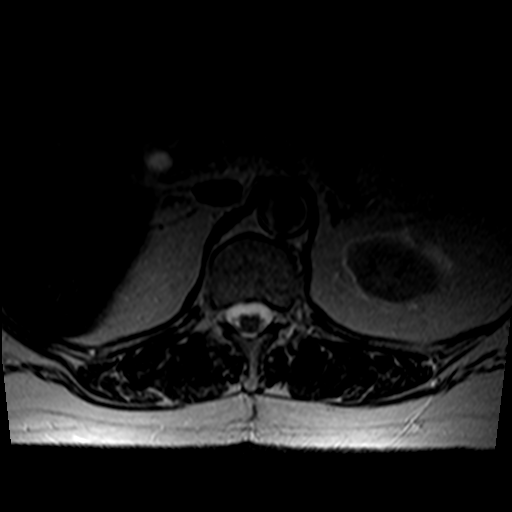

[Series 6: T1 · axial · 4.0mm · 0.45mm/px · z∈[-82,+86]mm · 3 of 39 slices shown (2 of 2)]
[im 6/39]
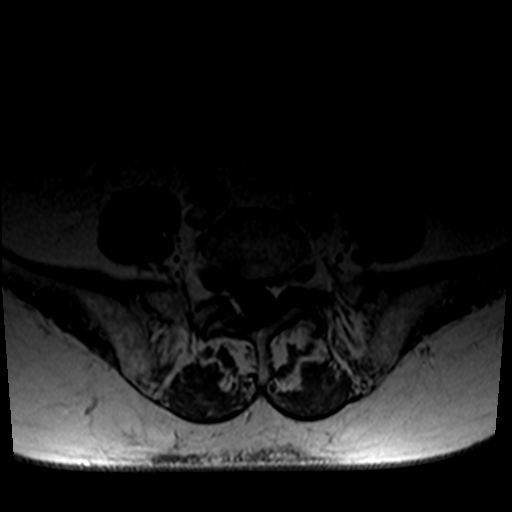
[im 21/39]
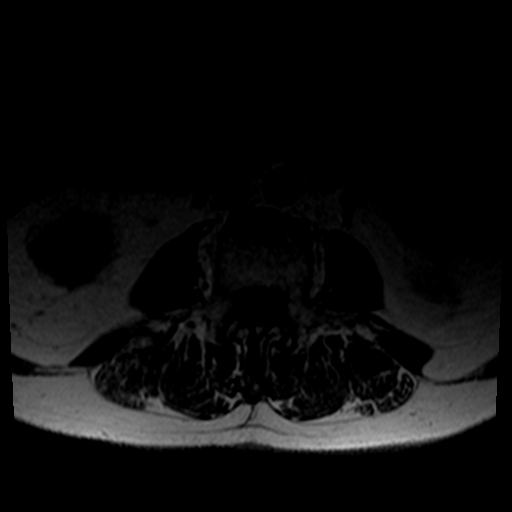
[im 33/39]
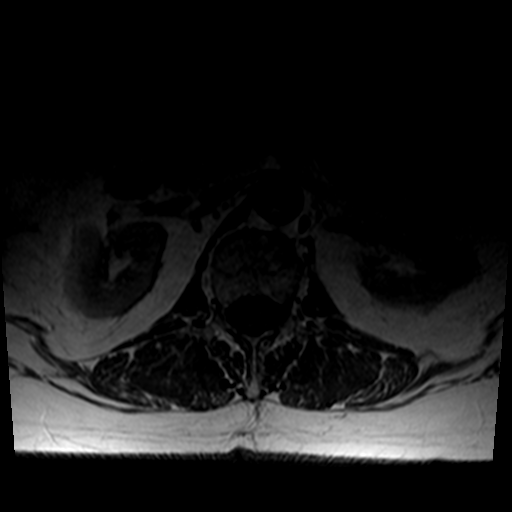

[21 of 48 positions shown; findings below may reference images not displayed]

FINDINGS: Alignment: The alignment of the lumbar spine appears
maintained. No significant
subluxation.

Marrow signal: The marrow signal of vertebral bodies appears
within normal
limits. No marrow edema identified to suggest an occult
fracture. No marrow
replacing lesions.

Vertebral body heights: Appear maintained.

Conus: The conus appears unremarkable. Located in normal
position.

Lower thoracic discs: Mild to moderate degenerative changes
of the visualized
lower thoracic spine

L1-L2: Disc desiccation. Posterior annular fissure. Facet
arthropathy. 3 mm
broad-based posterior disc protrusion. Thecal sac 11 mm.
Moderate bilateral
foraminal stenosis

L2-L3: Disc height normal. Disc desiccation. 4 mm
broad-based posterior disc
protrusion. Facet arthropathy. Thecal sac 8 mm. Mild to
moderate central
stenosis. Moderate bilateral foraminal stenosis.

L3-L4: Loss of disc height. Disc desiccation. 3 mm disc
protrusion. Thecal sac 7
mm. Facet arthropathy. Moderate central stenosis. Moderate
bilateral foraminal
stenosis.

L4-L5: Loss of disc height. Disc desiccation. Facet
arthropathy. Posterior
annular fissure. 4 mm disc protrusion. Thecal sac 7 mm.
Moderate central
stenosis. Moderate bilateral foraminal stenosis.

L5-S1: Disc desiccation. Posterior annular fissure. Facet
arthropathy. Thecal
sac 11 mm. Moderate foraminal stenosis.

Visualized sacral spine: Where visualized appears
unremarkable.
IMPRESSION: Moderate diffuse lumbar spondylosis. Multilevel degenerative
disc changes and
facet arthropathy

Moderate bilateral foraminal stenosis L1-L2 and L5-S1

Mild to moderate central and moderate bilateral foraminal
stenosis L2-L3

Moderate central and moderate bilateral foraminal stenosis
L3-L4 and L4-L5

DIEM

## 2022-04-04 IMAGING — MR MR shoulder LT wo con
6 of 7 series · 33 of 40 positions shown · non-contrast
Comparison: none

Procedure(s): MR shoulder LT wo con

MRI EXAMINATION OF THE LEFT SHOULDER:
HISTORY: Shoulder pain, back pain comparison plain films 03/14/2022

[Series 4: GRE · axial · 4.0mm · 0.50mm/px · z∈[-11,+80]mm · 7 of 20 slices shown]
[im 1/20]
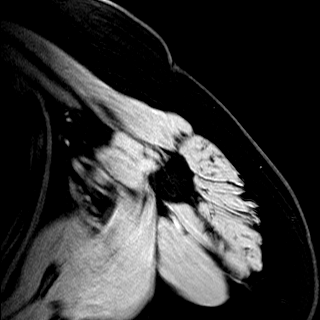
[im 4/20]
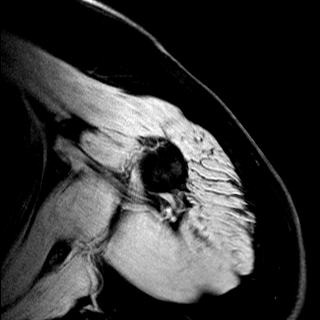
[im 7/20]
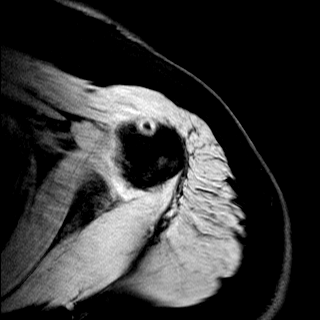
[im 10/20]
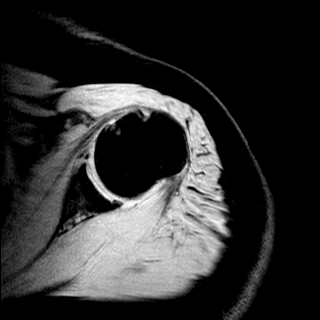
[im 13/20]
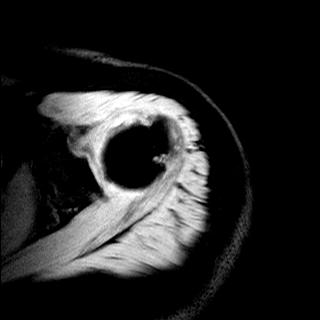
[im 16/20]
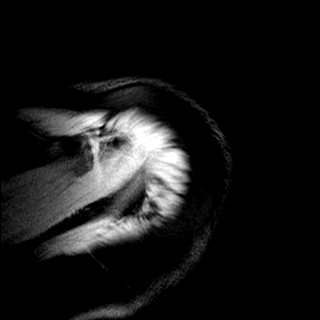
[im 20/20]
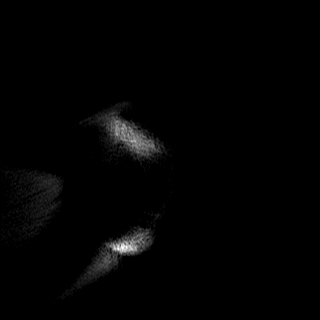

[Series 5: T1 · coronal · 4.0mm · 0.31mm/px · 6 of 17 slices shown]
[im 1/17]
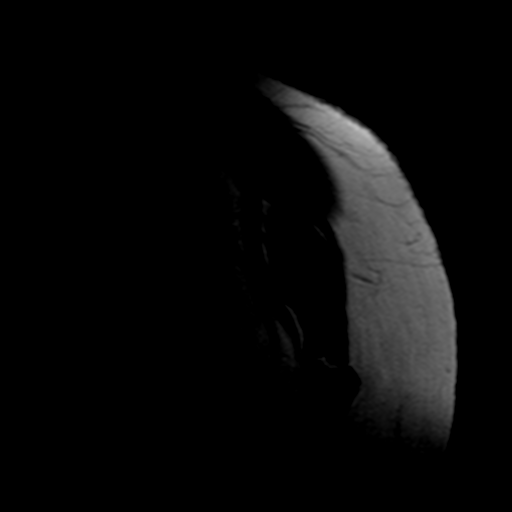
[im 4/17]
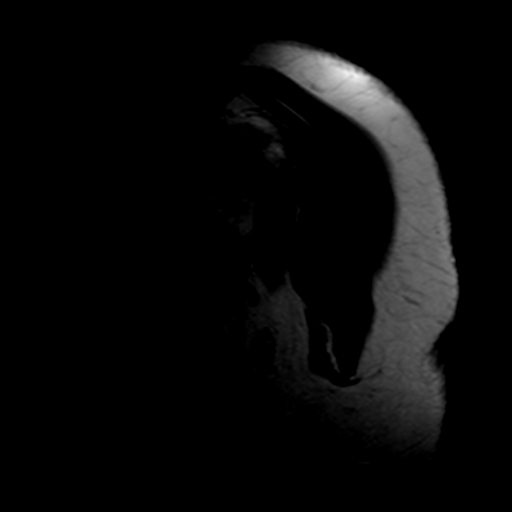
[im 7/17]
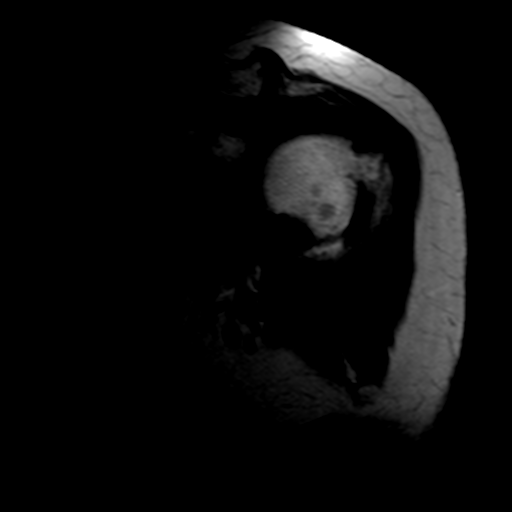
[im 10/17]
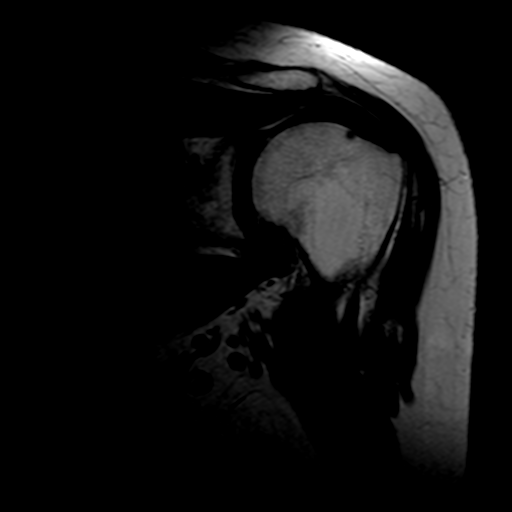
[im 13/17]
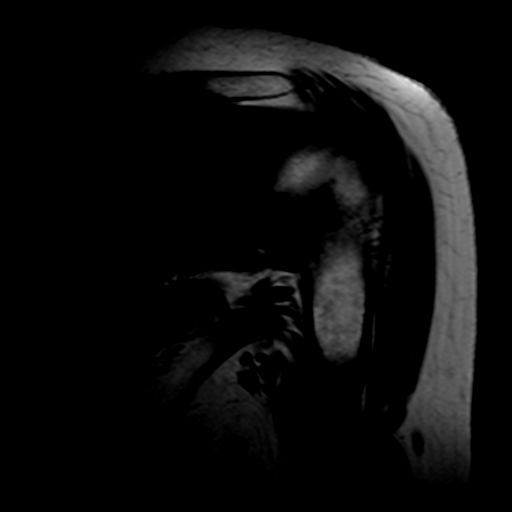
[im 17/17]
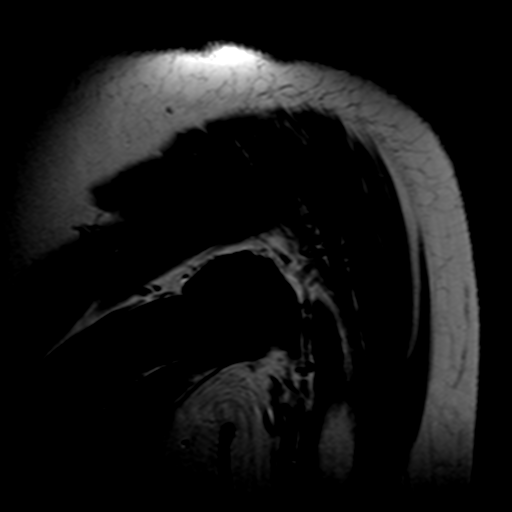

[Series 6: T2 · coronal · 4.0mm · 0.50mm/px · 6 of 17 slices shown]
[im 1/17]
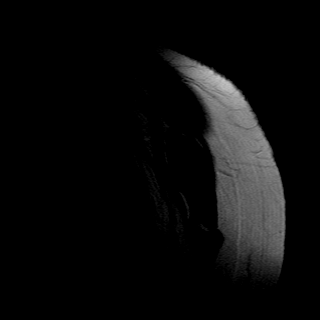
[im 4/17]
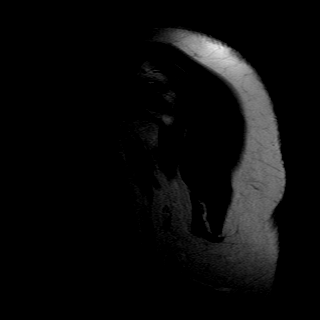
[im 7/17]
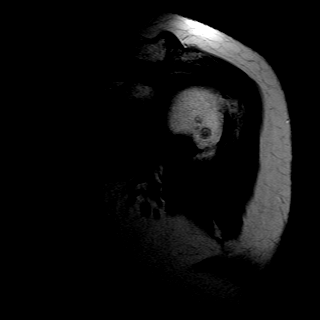
[im 10/17]
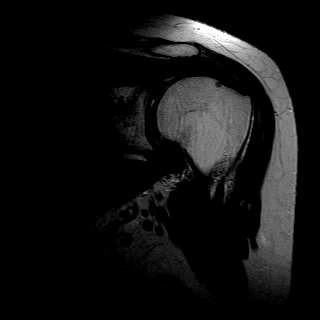
[im 13/17]
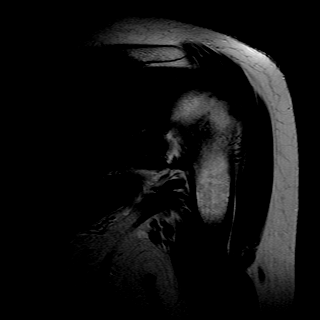
[im 17/17]
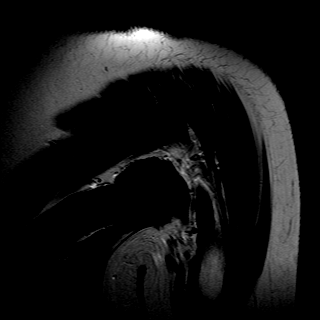

[Series 7: STIR · coronal · 4.0mm · 0.31mm/px · 3 of 17 slices shown]
[im 1/17]
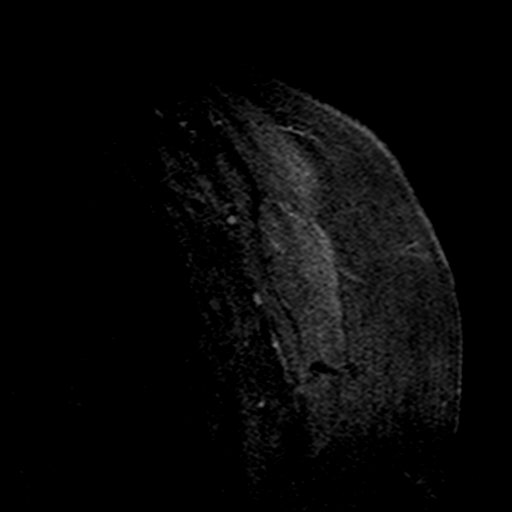
[im 5/17]
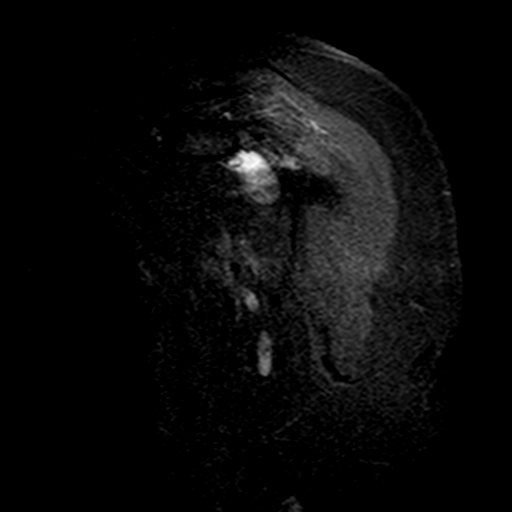
[im 9/17]
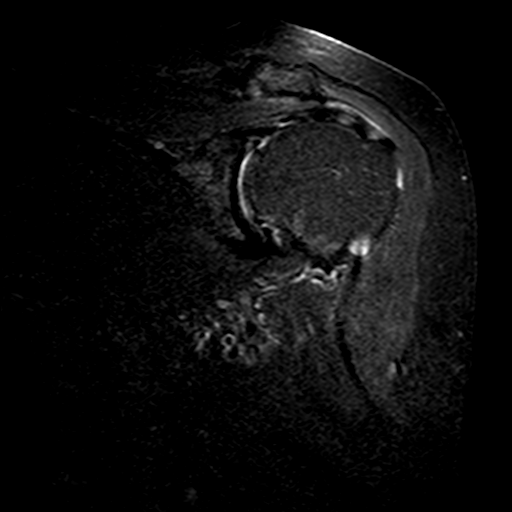

[Series 9: PD fat-sat · oblique · 4.0mm · 0.50mm/px · 5 of 17 slices shown (1 of 2)]
[im 1/17]
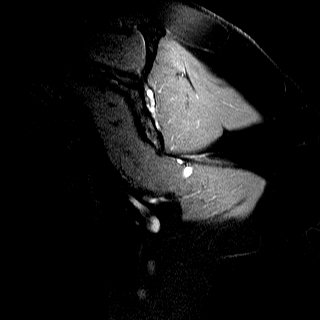
[im 5/17]
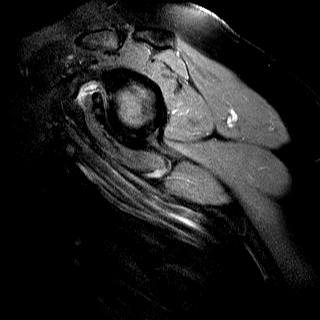
[im 9/17]
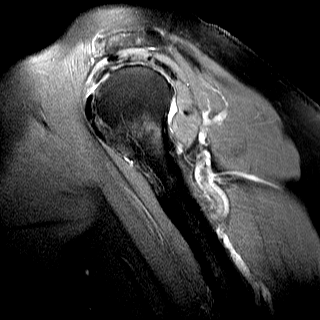
[im 13/17]
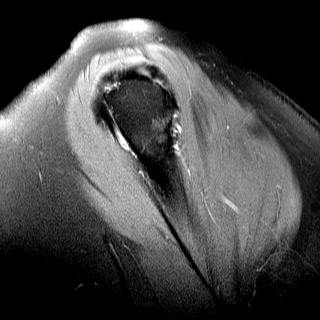
[im 17/17]
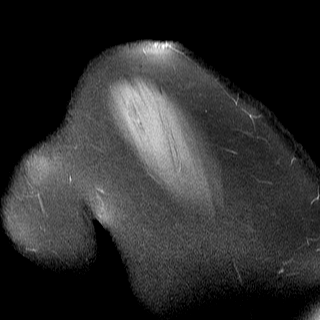

[Series 10: PD fat-sat · axial · 4.0mm · 0.50mm/px · z∈[-11,+80]mm · 6 of 20 slices shown (2 of 2)]
[im 1/20]
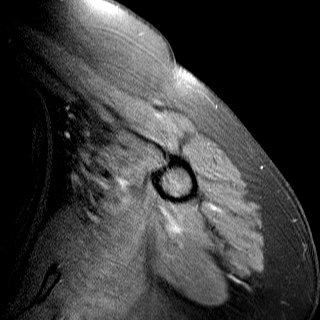
[im 4/20]
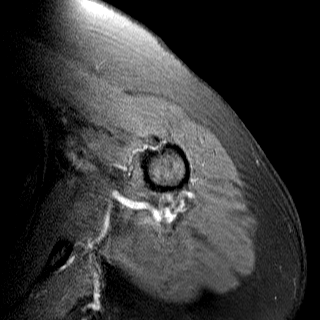
[im 8/20]
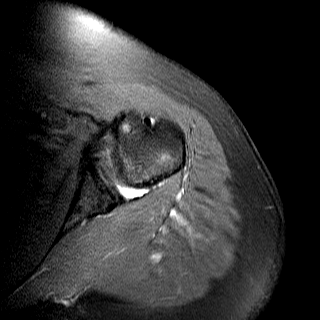
[im 12/20]
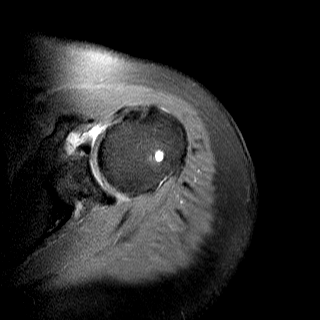
[im 16/20]
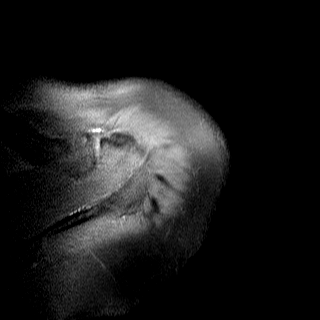
[im 20/20]
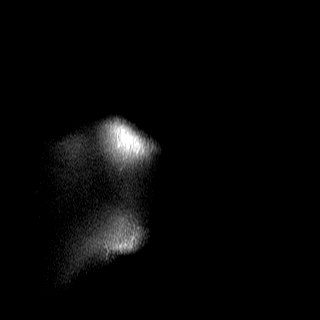

[33 of 40 positions shown; findings below may reference images not displayed]

FINDINGS: Standard MRI examination of the shoulder was performed.
Images were obtained in
the sagittal, axial and coronal planes.

Osseous and Cartilaginous Structures:
Mild degenerative changes present in the acromioclavicular
joint. The acromion
is type II without anterior or lateral downsloping.
Degenerative narrowing of
the glenohumeral joint is demonstrated with small osteophyte
extending inferior
medially off the humeral head. No acute bony injury is
evident.

Rotator Cuff:
There are no findings of muscular atrophy.. Mild tendinitis
at the supraspinatus
insertion. The supraspinatus and infraspinatus otherwise
appear intact.  The
subscapularis muscle and tendon are intact. The biceps
tendon appears
unremarkable.

Shoulder Joint and Labrum:
Superior labral tearing is demonstrated on series 7 images 7
through 12. The
labrum otherwise appears intact. There are no findings of
labral displacement or
paralabral cyst. Small joint effusion is present.  The
inferior glenohumeral
ligament is intact.
IMPRESSION: 1. Mild degenerative change in the acromioclavicular joint.
There is slight
degenerative tapering of the glenohumeral joint with small
osteophyte inferior
medially off the humeral head.

2. Mild tendinitis along the distal supraspinatus with the
rotator cuff and
biceps tendons otherwise intact.

3. Superior labral tearing is felt present on series 7
images 7 through 12. No
other labral abnormality is evident.

## 2022-05-27 IMAGING — RF Pain Clinic Fluoro Inclusive
2 series · 2 of 2 positions shown · non-contrast
Comparison: none

[Series 11: run · 0.15mm/px · 1 of 1 slices shown (1 of 2)]
[im 1/1]
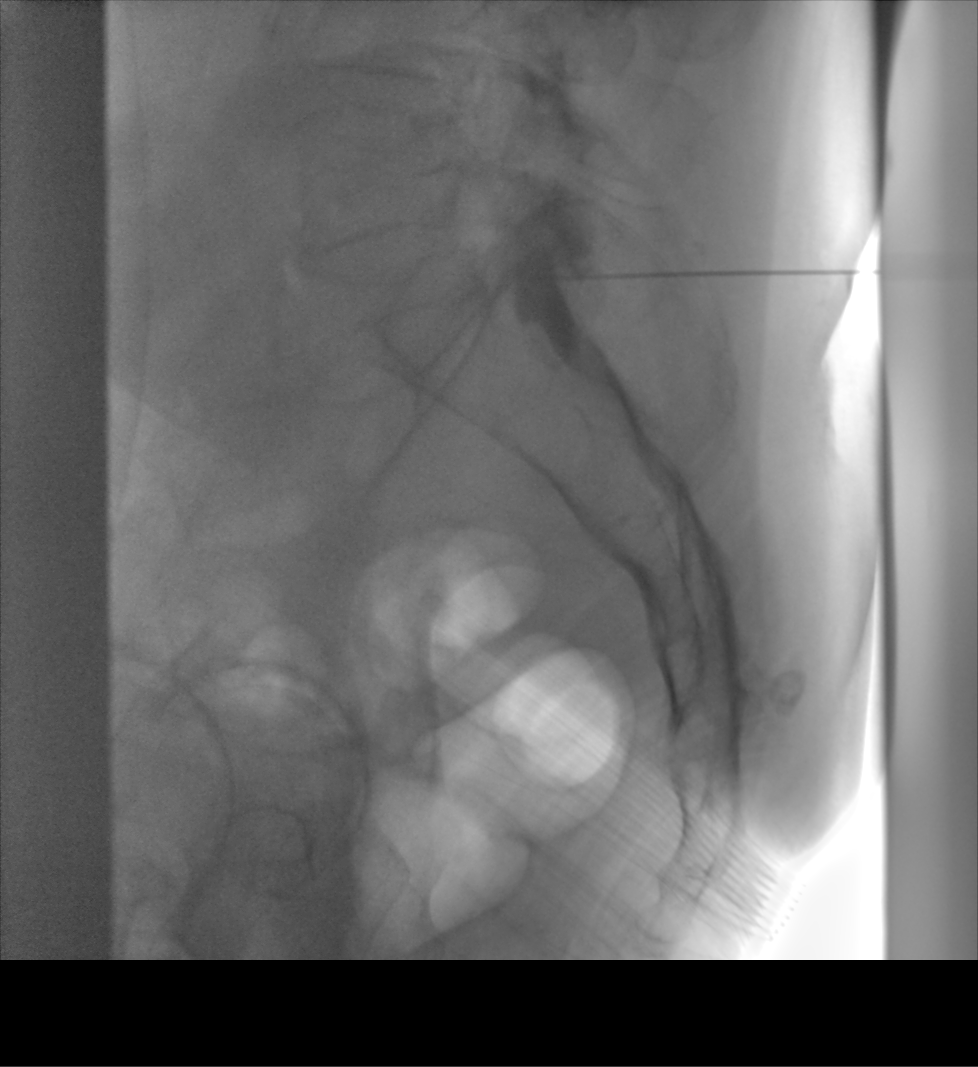

[Series 13: run · 0.15mm/px · 1 of 1 slices shown (2 of 2)]
[im 1/1]
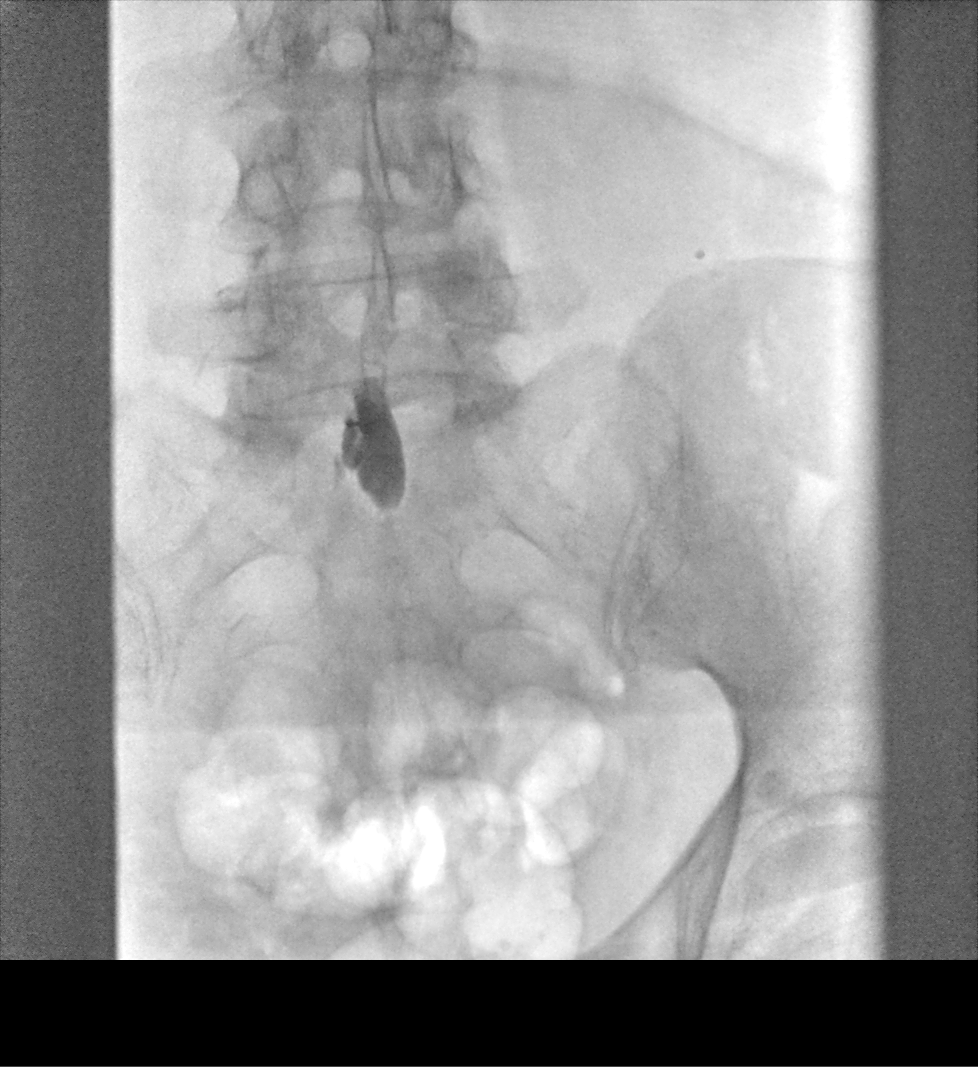

[2 of 2 positions shown; findings below may reference images not displayed]

Procedure(s): Pain Clinic Fluoro Inclusive

Fluoroscopic images were obtained with the use of contrast media during a lumbar interlaminar
epidural steroid injection.  Lace Vidad, CRNA reviewed the images at the time of image capture.
9 seconds of fluoro time was used.

## 2023-02-21 NOTE — Assessment & Plan Note (Signed)
Associated Problem(s): Peripheral polyneuropathy  Formatting of this note might be different from the original.  ASSESSMENT: Newly noted over the last 3 to 4 months, decreased vibratory and monofilament sensation right toes to distal large, skin intact, DDx compensatory injury from chronic right ankle pain, B12 deficiency, diabetes PLAN: Education provided, patient agreeable to x-ray and monitoring, consider B12 with repeat A1c at next labs    Electronically signed by Clarnce Flock, APRN at 02/21/2023  8:31 AM EDT

## 2023-02-21 NOTE — Progress Notes (Signed)
Formatting of this note is different from the original.  Catherine Li  1941/06/15   [id number]     SUBJECTIVE:     Catherine Li comes to the office today for the following reasons:   Chief Complaint   Patient presents with    Ankle Pain       HPI: Catherine Li presents today to discuss chronic right ankle pain.    Endorses-aching sore pain which is not too bad in the morning and slowly progresses until she is unable to ambulate at night, numbness and tingling right toes and balls of feet for greater than 3 months, disrupts gait    Denies-improvement with PT    Attempted- Tylenol     Current Outpatient Medications   Medication Sig    albuterol HFA (ProAir HFA) 108 (90 Base) MCG/ACT Aero Soln Inhalation 2 Puffs every 4 hours as needed (shortness of breath).    atorvastatin (Lipitor) 40 MG Tab Take 1 Tablet (40 mg total) by mouth daily.    famotidine (Pepcid) 20 MG Tab Take 1 Tablet (20 mg total) nightly by mouth (Patient taking differently: Take 20 mg by mouth nightly as needed.)    Aspirin 81 MG Tab Take 81 mg by mouth daily    acetaminophen 500 MG Tab Take 500 mg by mouth Daily before bed       Past Medical History:   Diagnosis Date    Advanced care planning/counseling discussion     Arthritis     CAD (coronary artery disease) 06/25/2019    Carpal tunnel syndrome, bilateral upper limbs     Grief     High blood pressure     History of ovarian cancer 09/04/2018    Hyperlipidemia     Hyperlipidemia, unspecified 07/16/2018    The ASCVD Risk score Denman George DC Jr., et al., 2013) failed to calculate for the following reasons:   The patient has a prior MI or stroke diagnosis     Impaired glucose tolerance     Left hip pain     Low back pain     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 Body mass index is 28.13 kg/m.     Physical Exam  Vitals and nursing note reviewed.   Constitutional:       General: She is not in acute distress.     Appearance: Normal appearance. She is not ill-appearing or diaphoretic.   HENT:      Head: Normocephalic and atraumatic.   Eyes:      General: No scleral icterus.  Cardiovascular:      Pulses:           Dorsalis pedis pulses are 2+ on the right side and 2+ on the left side.        Posterior tibial pulses are  2+ on the right side and 2+ on the left side.   Pulmonary:      Effort: Pulmonary effort is normal. No respiratory distress.   Musculoskeletal:         General: Normal range of motion.      Cervical back: Normal range of motion.      Right lower leg: No edema.      Left lower leg: No edema.      Right ankle: Normal. No swelling, deformity, ecchymosis or lacerations. No tenderness. Normal range of motion.      Right Achilles Tendon: Normal.      Left ankle: Normal.      Right foot: Normal.      Left foot: Normal.   Feet:      Right foot:      Skin integrity: Skin integrity normal.      Toenail Condition: Right toenails are normal.      Left foot:      Skin integrity: Skin integrity normal.      Toenail Condition: Left toenails are normal.   Neurological:      General: No focal deficit present.      Mental Status: She is alert and oriented to person, place, and time.      GCS: GCS eye subscore is 4. GCS verbal subscore is 5. GCS motor subscore is 6.      Cranial Nerves: Cranial nerves 2-12 are intact.      Motor: Motor function is intact.      Coordination: Coordination is intact. Coordination normal.      Gait: Gait is intact.   Psychiatric:         Attention and Perception: Attention and perception normal.         Mood and Affect: Mood and affect normal.         Speech: Speech normal.         Behavior: Behavior normal. Behavior is  cooperative.         Thought Content: Thought content normal.         Cognition and Memory: Cognition and memory normal.         Judgment: Judgment normal.      Comments: Pleasant and appropriate, good hygiene and eye contact, future oriented     Pedal Pulses: normal  Monofilament*: diminished right toes  Skin:  intact  Toenails: normal  Neurologic: Vibratory sensation: diminished right toes          DIAGNOSES      /     ASSESSMENT AND PLANS:     Chronic pain of right ankle  ASSESSMENT: Developed over the last 10 months after right sciatic pain which has been managed with at home treatment and PT, disrupts gait as well as daily activities, appropriate home management PLAN: Education provided, x-ray ordered and referral placed to [facility] orthopedics for further evaluation, continue with home management    Peripheral polyneuropathy  ASSESSMENT: Newly noted over the last 3 to 4 months, decreased vibratory and monofilament sensation right toes to distal large, skin intact, DDx compensatory injury from chronic right ankle pain, B12 deficiency, diabetes PLAN: Education provided, patient agreeable to x-ray and monitoring, consider B12 with repeat A1c at next labs    Orders Placed This Encounter   Procedures    XR Ankle Right Min 3 VW     Standing Status:   Future     Standing Expiration Date:   02/21/2024  Order Specific Question:   Indication Signs/Symptoms (No Rule Out)     Answer:   intermittent pain     Order Specific Question:   Post Imaging Patient Instruction?     Answer:   Discharge Patient Post Imaging    Ambulatory Generic Referral     Referral Priority:   Next available appt     Referral Location:   [facility]     Requested Specialty:   Orthopaedic Surgery     Number of Visits Requested:   50     Expiration Date:   02/21/2024     No orders of the defined types were placed in this encounter.    Follow-up and Dispositions    Return if symptoms worsen or fail to improve.      Future  Appointments   Date Time Provider Department Center   10/13/2023  1:30 PM [facility] 1 [facility] Liv Img   10/13/2023  2:00 PM Delker, Darden Dates, APRN [facility]     Clarnce Flock, APRN   02/21/2023     DISCLAIMER:  Voice recognition software may have been used to create portions of this document.  An attempt at proofreading has been made to minimize errors.  If, however, the reader notes inconsistent information or has questions concerning any part of the document, please contact our out-patient service for clinical clarification.  Electronically signed by Clarnce Flock, APRN at 02/21/2023  8:31 AM EDT

## 2023-02-21 NOTE — Progress Notes (Signed)
Formatting of this note might be different from the original.  Patient is here today for right ankle pain, it started six months ago. She has done PT, it helped with her back but not her ankle. When she wakes up in the morning her ankle is not that bad but as she goes on through out the day it starts to hurt. Patient denies any swelling. Pain level is an 8, she barely step on it by the evening. There is no none injury to the ankle.  Electronically signed by Emily Filbert L at 02/21/2023  8:31 AM EDT

## 2023-02-21 NOTE — Assessment & Plan Note (Signed)
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  ASSESSMENT: Developed over the last 10 months after right sciatic pain which has been managed with at home treatment and PT, disrupts gait as well as daily activities, appropriate home management PLAN: Education provided, x-ray ordered and referral placed to [facility] orthopedics for further evaluation, continue with home management  Electronically signed by Clarnce Flock, APRN at 02/21/2023  8:29 AM EDT

## 2023-02-28 NOTE — Telephone Encounter (Signed)
Pt name and DOB verified.    Referred by: Delker, Lovena Le, APRN - NP    Referred for: Pain in right ankle and joints of right foot     When patient calls back please schedule NP appt with MB or WN. Please ask all new patient questions. Please assign the referral to the appt.        [phone] (home)

## 2023-02-28 NOTE — Telephone Encounter (Signed)
Noted, thank you. X-ray of right ankle dated 02/21/23.   Catherine Li. RMA

## 2023-02-28 NOTE — Telephone Encounter (Signed)
Catherine Li  22-Sep-1940  Type of referral: internal/external/ ED external  Who is the referring office and referring provider if yes, when and where) [facility]       Were you seen in the ER no      Date of Injury   no          What do you need to be seen for: Left/right/bilateral  Pain in right ankle and joints of right foot       Have you had xrays in the last 6 months? Yes            Where:    [facility]        If not within 6 months please schedule (Note if a minor NP we do not schedule an xray, unless provider reviews chart and requests xray)          Have you had MRI within 6 months:  no          If yes where:            Have you had an EMG?  no          Where?             Is this going through the [place]? no           If yes, please verify that we have an up to date referral with correct diagnosis.            Workers comp Yes/No   no         If yes, please bring [employer] with you to appointment.              Have you done Physical/Occupational Therapy?   yes          Where?     [facility]                   Have you had prior surgery before for this diagnosis?  no    With who/where?           Have you had a total joint replacement (hip or knee)?        (If yes please book with Namon Cirri or Ninfa Linden ) n/a    Have you seen prior Ortho provider before?   no          If yes who?                   Please get a copy of previous records to bring with you to appointment.   Did scheduler request records?                     Please note our office is a scent neutral zone, please refrain from wearing perfume, cologne, and/or other fragrances.    Scheduled with Dr. Elita Boone  03/29/23 at 15:00  Arrival 14:45    [phone] (home)

## 2023-03-29 ENCOUNTER — Ambulatory Visit
Admit: 2023-03-29 | Discharge: 2023-03-29 | Payer: PRIVATE HEALTH INSURANCE | Attending: Orthopaedic Surgery | Primary: Family

## 2023-03-29 DIAGNOSIS — M25571 Pain in right ankle and joints of right foot: Secondary | ICD-10-CM

## 2023-03-29 NOTE — Progress Notes (Signed)
NEW PATIENT VISIT  ______________________________________________________________________     Name: Catherine Li  DOB: Aug 17, 1941  Today's Date: 03/29/2023    Chief Complaint:  Chief Complaint   Patient presents with    New Patient     RT ankle and foot pain      ______________________________________________________________________    HPI:  Catherine Li is a very pleasant 82 y.o. female who comes in with a complaint of chronic right ankle pain associated with numbness and tingling of the toes.     She reports pain in and around the sinus tarsi and lateral hindfoot towards the end of each day after periods on her feet.  She does not recall any instigating factor or any change in circumstance the pain is been there for about 3 or 4 months now.  She is tried rest ice and elevation.  She is tried and had some benefit with CBD salve but also with Crista Elliot.  She wakes in the morning and there is little swelling and very little pain it seems to accumulate across the day and become quite unpleasant by nighttime she does see some swelling although not dramatic.    She is past medical history of arthritis, carpal tunnel syndrome, coronary artery disease, hypertension, hyperlipidemia and previous NSTEMI.  She has low back pain with sciatica and a history of ovarian cancer.  ______________________________________________________________________    ROS  General: no fevers, night sweats, unexplained weight loss  Psychiatric: no suicidal ideation  Neurologic: no headaches, loss of consciousness or change in vision  Cardiovascular: no chest pain  Respiratory: no shortness of breath  GI: no abdominal pain, no ulcers  Skin: no infections, no rashes  Hematologic: no bleeding diatheses  Allergic/Immunologic: no nasal congestion, frequent infections, seasonal allergies  Musckuloskeletal:   Chief Complaint   Patient presents with    New Patient     RT ankle and foot pain       ______________________________________________________________________    PHYSICAL EXAM:right foot and ankle  BP 122/68   Pulse 60   Ht 1.6 m (5\' 3" )   Wt 71.2 kg (157 lb)   BMI 27.81 kg/m   @PATIENTWT @  Body mass index is 27.81 kg/m.    General  The patient is a well-appearing 82 y.o. female who appears her stated age. She is alert and oriented x3, pleasant, conversive, interactive and appropriate.    Gait  Heel to toe gait in shoe wear    Skin  Foot/Ankle:  Intact, no abrasions or open wounds.    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 2+ dorsalis pedis pulse. Capillary refill is brisk to all digits.    Sensory  Sensation is intact to light touch in all 5 nerve distributions to the foot.  Subjectively diminished quality of light touch sensation in a stocking distribution to midfoot    Motor  Fires extensor hallucis longus, flexor hallucis longus,  extensor digitorum, flexor digitorum, anterior tibialis, gastrocnemius.  ______________________________________________________________________    IMAGING:  Right ankle three view plain film radiographs obtained nonweightbearing at [facility] on 02/21/23.. Radiographs are independently viewed and interpreted by me.    Radiographs demonstrate no acute bony injury with preservation major joints visualized.  Preservation of the alignment and  shape of the ankle mortise.  ______________________________________________________________________    IMPRESSION:  Encounter Diagnoses   Name Primary?    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  Her pain is coming from the distal portion of the sinus tarsi and I think is related to the degree of flatfoot collapse    I have recommended home stretching program to work on calf tightness.I have provided the patient with a foot and ankle conditioning program information sheet from the [employer] ([employer]) and highlighted the exercises I would like them to perform.  We demonstrated this stretch in clinic also.    I have recommended an over-the-counter arch support orthotic with built-in metatarsal relief which can be purchased online at [email or web address] [facility]).  I have provided the patient with a printout.    I would expect for her to feel a difference within a month.  If she gets to the end of summer with no improvement she should contact the office and we can consider an injection    FOLLOW-UP: prn    IMAGING NEXT VISIT: No  ______________________________________________________________________    PMHx: History reviewed. No pertinent past medical history.  PSgHx: History reviewed. No pertinent surgical history.  ALLERGIES:   Allergies   Allergen Reactions    Cephalexin Nausea Only     unaware of reaction     Rosuvastatin Other (See Comments)     Flu-like symptoms, unaware of reaction      MEDS:   Current Outpatient Medications:     Aspirin 81 MG CAPS, Take 81 mg by mouth daily, Disp: , Rfl:     acetaminophen (TYLENOL) 500 MG tablet, Take 1 tablet by mouth, Disp: , Rfl:     atorvastatin (LIPITOR) 40 MG tablet, Take 1 tablet by mouth daily, Disp: , Rfl:      famotidine (PEPCID) 20 MG tablet, Take 1 tablet by mouth nightly as needed, Disp: , Rfl:   SHx:   Social History     Socioeconomic History    Marital status: Widowed     Spouse name: Not on file    Number of children: Not on file    Years of education: Not on file    Highest education level: Not on file   Occupational History    Not on file   Tobacco Use    Smoking status: Every Day     Current packs/day: 0.50     Types: Cigarettes    Smokeless tobacco: Never   Vaping Use    Vaping Use: Never used   Substance and Sexual Activity    Alcohol use: Not Currently    Drug use: Never     Comment: CBD salve as needed    Sexual activity: Not on file   Other Topics Concern    Not on file   Social History Narrative    Not on file  Social Determinants of Health     Financial Resource Strain: Not on file   Food Insecurity: Not on file   Transportation Needs: Not on file   Physical Activity: Not on file   Stress: Not on file   Social Connections: Not on file   Intimate Partner Violence: Not on file   Housing Stability: Not on file     FHx: History reviewed. No pertinent family history.    Disclaimer:  Portion of this document may contain text elements generated through computerized voice recognition.  Undetected computer generated word errors are possible. Please notify the signing provider or return the document to [facility] Medical Records Department if clarification or correction is needed.  ______________________________________________________________________  3:13 PM, 03/29/2023
